# Patient Record
Sex: Female | Born: 1965 | Race: White | Hispanic: No | Marital: Married | Smoking: Former smoker
Health system: Southern US, Community
[De-identification: ages and names within clinical notes are randomized; demographics above are authoritative.]

## PROBLEM LIST (undated history)

## (undated) DIAGNOSIS — K219 Gastro-esophageal reflux disease without esophagitis: Secondary | ICD-10-CM

## (undated) DIAGNOSIS — F419 Anxiety disorder, unspecified: Secondary | ICD-10-CM

## (undated) DIAGNOSIS — I10 Essential (primary) hypertension: Secondary | ICD-10-CM

## (undated) DIAGNOSIS — E785 Hyperlipidemia, unspecified: Secondary | ICD-10-CM

## (undated) HISTORY — DX: Anxiety disorder, unspecified: F41.9

## (undated) HISTORY — PX: TONSILLECTOMY AND ADENOIDECTOMY: SHX28

## (undated) HISTORY — DX: Gastro-esophageal reflux disease without esophagitis: K21.9

## (undated) HISTORY — DX: Essential (primary) hypertension: I10

## (undated) HISTORY — DX: Hyperlipidemia, unspecified: E78.5

---

## 2013-01-04 DIAGNOSIS — F3132 Bipolar disorder, current episode depressed, moderate: Secondary | ICD-10-CM | POA: Insufficient documentation

## 2013-01-04 DIAGNOSIS — F17200 Nicotine dependence, unspecified, uncomplicated: Secondary | ICD-10-CM | POA: Insufficient documentation

## 2013-01-04 DIAGNOSIS — F411 Generalized anxiety disorder: Secondary | ICD-10-CM | POA: Insufficient documentation

## 2013-05-29 DIAGNOSIS — F4310 Post-traumatic stress disorder, unspecified: Secondary | ICD-10-CM | POA: Insufficient documentation

## 2013-07-23 DIAGNOSIS — G4700 Insomnia, unspecified: Secondary | ICD-10-CM | POA: Insufficient documentation

## 2013-07-23 DIAGNOSIS — R442 Other hallucinations: Secondary | ICD-10-CM | POA: Insufficient documentation

## 2014-10-25 DIAGNOSIS — J309 Allergic rhinitis, unspecified: Secondary | ICD-10-CM | POA: Insufficient documentation

## 2014-10-25 DIAGNOSIS — K219 Gastro-esophageal reflux disease without esophagitis: Secondary | ICD-10-CM | POA: Insufficient documentation

## 2017-08-03 DIAGNOSIS — Z800 Family history of malignant neoplasm of digestive organs: Secondary | ICD-10-CM | POA: Insufficient documentation

## 2017-08-03 DIAGNOSIS — N941 Unspecified dyspareunia: Secondary | ICD-10-CM | POA: Insufficient documentation

## 2018-03-02 DIAGNOSIS — E785 Hyperlipidemia, unspecified: Secondary | ICD-10-CM | POA: Insufficient documentation

## 2018-03-02 LAB — HM PAP SMEAR: HM Pap smear: NEGATIVE

## 2018-03-02 LAB — RESULTS CONSOLE HPV: CHL HPV: NEGATIVE

## 2018-11-30 DIAGNOSIS — N3946 Mixed incontinence: Secondary | ICD-10-CM | POA: Insufficient documentation

## 2020-02-01 LAB — HM HIV SCREENING LAB: HM HIV Screening: NEGATIVE

## 2020-02-01 LAB — HM HEPATITIS C SCREENING LAB: HM Hepatitis Screen: NEGATIVE

## 2020-02-04 DIAGNOSIS — K1321 Leukoplakia of oral mucosa, including tongue: Secondary | ICD-10-CM | POA: Insufficient documentation

## 2020-04-08 DIAGNOSIS — I10 Essential (primary) hypertension: Secondary | ICD-10-CM | POA: Insufficient documentation

## 2020-05-20 DIAGNOSIS — F1029 Alcohol dependence with unspecified alcohol-induced disorder: Secondary | ICD-10-CM | POA: Insufficient documentation

## 2020-07-03 LAB — HM COLONOSCOPY

## 2021-05-19 LAB — HM MAMMOGRAPHY

## 2022-01-04 DIAGNOSIS — H409 Unspecified glaucoma: Secondary | ICD-10-CM | POA: Insufficient documentation

## 2022-03-05 ENCOUNTER — Ambulatory Visit (INDEPENDENT_AMBULATORY_CARE_PROVIDER_SITE_OTHER)
Admission: RE | Admit: 2022-03-05 | Discharge: 2022-03-05 | Disposition: A | Discharge status: Home / Self Care | Payer: Medicare Other | Source: Ambulatory Visit | Attending: Nurse Practitioner | Admitting: Nurse Practitioner

## 2022-03-05 ENCOUNTER — Ambulatory Visit (INDEPENDENT_AMBULATORY_CARE_PROVIDER_SITE_OTHER): Payer: Medicare Other | Admitting: Nurse Practitioner

## 2022-03-05 ENCOUNTER — Encounter: Payer: Self-pay | Admitting: Nurse Practitioner

## 2022-03-05 VITALS — BP 110/68 | HR 84 | Temp 97.2°F | Ht 64.0 in | Wt 187.4 lb

## 2022-03-05 DIAGNOSIS — F411 Generalized anxiety disorder: Secondary | ICD-10-CM

## 2022-03-05 DIAGNOSIS — E66811 Obesity, class 1: Secondary | ICD-10-CM

## 2022-03-05 DIAGNOSIS — S91302A Unspecified open wound, left foot, initial encounter: Secondary | ICD-10-CM

## 2022-03-05 DIAGNOSIS — Z6832 Body mass index (BMI) 32.0-32.9, adult: Secondary | ICD-10-CM

## 2022-03-05 DIAGNOSIS — I10 Essential (primary) hypertension: Secondary | ICD-10-CM | POA: Diagnosis not present

## 2022-03-05 DIAGNOSIS — F17200 Nicotine dependence, unspecified, uncomplicated: Secondary | ICD-10-CM | POA: Diagnosis not present

## 2022-03-05 DIAGNOSIS — R7303 Prediabetes: Secondary | ICD-10-CM

## 2022-03-05 DIAGNOSIS — E785 Hyperlipidemia, unspecified: Secondary | ICD-10-CM | POA: Diagnosis not present

## 2022-03-05 DIAGNOSIS — E661 Drug-induced obesity: Secondary | ICD-10-CM

## 2022-03-05 DIAGNOSIS — F3132 Bipolar disorder, current episode depressed, moderate: Secondary | ICD-10-CM

## 2022-03-05 DIAGNOSIS — H409 Unspecified glaucoma: Secondary | ICD-10-CM

## 2022-03-05 NOTE — Assessment & Plan Note (Signed)
Patient currently on atorvastatin 20 mg.  Pending lab results continue taking atorvastatin 20 mg as prescribed

## 2022-03-05 NOTE — Assessment & Plan Note (Signed)
Patient currently maintained on amlodipine 10 mg daily.  Patient's blood pressure within normal limits in office visit

## 2022-03-05 NOTE — Assessment & Plan Note (Signed)
Currently managed by [facility] psychiatry.  Patient states she takes 325 mg of venlafaxine daily.  Continue taking medication as prescribed follow-up with psychiatry as recommended

## 2022-03-05 NOTE — Assessment & Plan Note (Signed)
Unsure of the [place] of the wound.  Was able to remove foreign bodies in office.  Will obtain x-ray of foot to make sure no retained foreign bodies if no retained foreign bodies she will soak in warm soapy water and keep covered to see if we can get it to heal if not we will refer to podiatry.  Pending foot x-ray

## 2022-03-05 NOTE — Assessment & Plan Note (Signed)
Likely related to lifestyle and medication induced.  Encouraged healthy lifestyle modifications pending labs

## 2022-03-05 NOTE — Assessment & Plan Note (Signed)
Patient is followed by psychiatry at [facility].  Continue taking medications as prescribed.  Follow-up as recommended

## 2022-03-05 NOTE — Progress Notes (Signed)
New Patient Office Visit  Subjective    Patient ID: Rachael Mosley, female    DOB: May 10, 1966  Age: 56 y.o. MRN: [id number]  CC:  Chief Complaint  Patient presents with   Establish Care    Np est care. Spider bite that wont heal x1 year. Weight management     HPI Rachael Mosley presents to establish care  Bipolar: Patient currently maintained on Abilify, Lamictal, venlafaxine.  Patient is followed through [facility] psychiatry.  HLD: eats 2-3 meals a day with little snacking. Water and seltzer water. Stationary bike and walking twice a wek at 15 mins a time  GAD: Patient currently being followed by [facility] psychiatry.  Currently on venlafaxine 375 mg daily  Tobacco use: cold [place] in the past. Lasted 2 years and then mother needed care. Last LDCT was 10/26/2021 that did show 75m nodule. Repeat in 1 year  Wound: Has a "bite" to the left lower extremity.  States has been there for a year and will not heal.  In the beginning he did have what seems to be like a serious sanguinous discharge but no discharge as of late.  Weight: States that she has been dieting since new years and lost 18 pounds for the last 6 weeks.  States that her psychiatrist has her on metformin due to the metabolic effects of the antipsychotics patient takes.  We discussed possibility using GLP-1 patient denies any history of medullary thyroid cancer, men's type II, pancreatitis.   Outpatient Encounter Medications as of 03/05/2022  Medication Sig   amLODipine (NORVASC) 10 MG tablet Take 10 mg by mouth daily.   ARIPiprazole (ABILIFY) 15 MG tablet Take 15 mg by mouth daily.   atorvastatin (LIPITOR) 20 MG tablet Take 20 mg by mouth daily.   gabapentin (NEURONTIN) 300 MG capsule Take 600 mg by mouth 2 (two) times daily.   lamoTRIgine (LAMICTAL) 200 MG tablet Take 200 mg by mouth 2 (two) times daily.   metFORMIN (GLUCOPHAGE) 1000 MG tablet Take 1,000 mg by mouth 2 (two) times daily with a meal.   omeprazole (PRILOSEC) 40 MG  capsule Take 40 mg by mouth daily.   venlafaxine (EFFEXOR) 37.5 MG tablet Take 375 mg by mouth daily.   [DISCONTINUED] amlodipine-atorvastatin (CADUET) 10-20 MG tablet Take 1 tablet by mouth daily.   No facility-administered encounter medications on file as of 03/05/2022.    Past Medical History:  Diagnosis Date   Anxiety    GERD (gastroesophageal reflux disease)    Hyperlipidemia    Hypertension     Past Surgical History:  Procedure Laterality Date   TONSILLECTOMY AND ADENOIDECTOMY      Family History  Problem Relation Age of Onset   Cancer Mother        FH maternal of esophegal cancer   Heart disease Father    Heart disease Paternal Grandfather     Social History   Socioeconomic History   Marital status: Married    Spouse name: Not on file   Number of children: 0   Years of education: Not on file   Highest education level: Doctorate  Occupational History   Not on file  Tobacco Use   Smoking status: Every Day    Packs/day: 1.00    Years: 25.00    Total pack years: 25.00    Types: Cigarettes   Smokeless tobacco: Never  Vaping Use   Vaping Use: Never used  Substance and Sexual Activity   Alcohol use: Yes    Comment:  1-2 drinks per night wine   Drug use: Not on file    Comment: once a day   Sexual activity: Yes  Other Topics Concern   Not on file  Social History Narrative   Retired Chief Executive Officer   Social Determinants of Radio broadcast assistant Strain: Not on file  Food Insecurity: Not on file  Transportation Needs: Not on file  Physical Activity: Not on file  Stress: Not on file  Social Connections: Not on file  Intimate Partner Violence: Not on file    Review of Systems  Constitutional:  Negative for chills and fever.  Respiratory:  Negative for shortness of breath.   Cardiovascular:  Negative for chest pain.  Skin:        Lesion "+"        Objective    BP 110/68   Pulse 84   Temp (!) 97.2 F (36.2 C) (Temporal)   Ht '5\' 4"'$  (1.626 m)    Wt 187 lb 6.4 oz (85 kg)   LMP 05/21/2020 (Approximate)   SpO2 92%   BMI 32.17 kg/m   Physical Exam Vitals and nursing note reviewed.  Constitutional:      Appearance: Normal appearance.  HENT:     Right Ear: Tympanic membrane, ear canal and external ear normal.     Left Ear: Tympanic membrane, ear canal and external ear normal.     Mouth/Throat:     Mouth: Mucous membranes are moist.     Pharynx: Oropharynx is clear.  Eyes:     Extraocular Movements: Extraocular movements intact.     Pupils: Pupils are equal, round, and reactive to light.     Comments: Wears glasses  Cardiovascular:     Rate and Rhythm: Normal rate and regular rhythm.     Heart sounds: Normal heart sounds.  Pulmonary:     Breath sounds: Normal breath sounds.  Abdominal:     General: Bowel sounds are normal.  Musculoskeletal:     Right lower leg: No edema.     Left lower leg: No edema.  Lymphadenopathy:     Cervical: No cervical adenopathy.  Skin:    Findings: Lesion present.          Comments: Lesion approximately the size of a pea.  On left distal lateral dorsal foot.  No erythema, edema, discharge.  Foreign body of what looks like to be animal hair was removed using sterile tweezers.  Neurological:     Mental Status: She is alert.  Psychiatric:        Mood and Affect: Mood normal.        Behavior: Behavior normal.        Thought Content: Thought content normal.        Judgment: Judgment normal.         Assessment & Plan:   Problem List Items Addressed This Visit       Cardiovascular and Mediastinum   Primary hypertension   Relevant Medications   atorvastatin (LIPITOR) 20 MG tablet   amLODipine (NORVASC) 10 MG tablet   Other Relevant Orders   CBC   Comprehensive metabolic panel   Lipid panel     Other   Bipolar disorder, current episode depressed, moderate (HCC)   Relevant Medications   venlafaxine (EFFEXOR) 37.5 MG tablet   Generalized anxiety disorder   Relevant Medications    venlafaxine (EFFEXOR) 37.5 MG tablet   Glaucoma   Hyperlipidemia   Relevant Medications   atorvastatin (LIPITOR) 20  MG tablet   amLODipine (NORVASC) 10 MG tablet   Other Relevant Orders   Lipid panel   Tobacco use disorder   Other Visit Diagnoses     Open wound of left foot excluding one or more toes, initial encounter    -  Primary   Relevant Orders   DG Foot Complete Left   Pre-diabetes       Relevant Orders   Hemoglobin A1c   TSH   Lipid panel   Class 1 drug-induced obesity with serious comorbidity and body mass index (BMI) of 32.0 to 32.9 in adult       Relevant Medications   metFORMIN (GLUCOPHAGE) 1000 MG tablet   Other Relevant Orders   TSH       Return in about 3 months (around 06/05/2022) for Recheck.   Romilda Garret, NP

## 2022-03-05 NOTE — Patient Instructions (Signed)
Nice to see you today I will be in touch with the labs once I have the result. Also will be in touch with the xray once I get the result Follow up with me in 3 months, sooner if you need me  Those medications are [facility] and Saxenda

## 2022-03-05 NOTE — Assessment & Plan Note (Signed)
Patient has quit in the past via the cold [place] method.  She is going start working on reducing and trying to quit smoking again.

## 2022-03-05 NOTE — Assessment & Plan Note (Signed)
Secondary to antipsychotic medication.  Patient currently maintained on metformin currently.  We did discuss about a GLP-1 to help with her weight and glycemic control.  Pending labs

## 2022-03-05 NOTE — Assessment & Plan Note (Signed)
Currently followed by eye care provider.  She states that she is seen every 6 months

## 2022-03-08 ENCOUNTER — Other Ambulatory Visit: Payer: Self-pay | Admitting: Nurse Practitioner

## 2022-03-08 DIAGNOSIS — S91302A Unspecified open wound, left foot, initial encounter: Secondary | ICD-10-CM

## 2022-03-11 ENCOUNTER — Other Ambulatory Visit (INDEPENDENT_AMBULATORY_CARE_PROVIDER_SITE_OTHER): Payer: Medicare Other

## 2022-03-11 DIAGNOSIS — I10 Essential (primary) hypertension: Secondary | ICD-10-CM | POA: Diagnosis not present

## 2022-03-11 DIAGNOSIS — E661 Drug-induced obesity: Secondary | ICD-10-CM | POA: Diagnosis not present

## 2022-03-11 DIAGNOSIS — E785 Hyperlipidemia, unspecified: Secondary | ICD-10-CM

## 2022-03-11 DIAGNOSIS — R7303 Prediabetes: Secondary | ICD-10-CM | POA: Diagnosis not present

## 2022-03-11 DIAGNOSIS — Z6832 Body mass index (BMI) 32.0-32.9, adult: Secondary | ICD-10-CM | POA: Diagnosis not present

## 2022-03-11 LAB — COMPREHENSIVE METABOLIC PANEL
ALT: 12 U/L (ref 0–35)
AST: 14 U/L (ref 0–37)
Albumin: 4.1 g/dL (ref 3.5–5.2)
Alkaline Phosphatase: 91 U/L (ref 39–117)
BUN: 11 mg/dL (ref 6–23)
CO2: 26 mEq/L (ref 19–32)
Calcium: 9.5 mg/dL (ref 8.4–10.5)
Chloride: 105 mEq/L (ref 96–112)
Creatinine, Ser: 0.51 mg/dL (ref 0.40–1.20)
GFR: 106.44 mL/min (ref >60.00)
Glucose, Bld: 99 mg/dL (ref 70–99)
Potassium: 4.3 mEq/L (ref 3.5–5.1)
Sodium: 140 mEq/L (ref 135–145)
Total Bilirubin: 0.2 mg/dL (ref 0.2–1.2)
Total Protein: 6.8 g/dL (ref 6.0–8.3)

## 2022-03-11 LAB — CBC
HCT: 45 % (ref 36.0–46.0)
Hemoglobin: 14.6 g/dL (ref 12.0–15.0)
MCHC: 32.5 g/dL (ref 30.0–36.0)
MCV: 89.3 fl (ref 78.0–100.0)
Platelets: 269 10*3/uL (ref 150.0–400.0)
RBC: 5.04 Mil/uL (ref 3.87–5.11)
RDW: 15.9 % — ABNORMAL HIGH (ref 11.5–15.5)
WBC: 15 10*3/uL — ABNORMAL HIGH (ref 4.0–10.5)

## 2022-03-11 LAB — HEMOGLOBIN A1C: Hgb A1c MFr Bld: 6.2 % (ref 4.6–6.5)

## 2022-03-11 LAB — LDL CHOLESTEROL, DIRECT: Direct LDL: 73 mg/dL

## 2022-03-11 LAB — LIPID PANEL
Cholesterol: 151 mg/dL (ref 0–200)
HDL: 41.1 mg/dL (ref >39.00)
NonHDL: 110.34
Total CHOL/HDL Ratio: 4
Triglycerides: 313 mg/dL — ABNORMAL HIGH (ref 0.0–149.0)
VLDL: 62.6 mg/dL — ABNORMAL HIGH (ref 0.0–40.0)

## 2022-03-11 LAB — TSH: TSH: 1.22 u[IU]/mL (ref 0.35–5.50)

## 2022-03-15 ENCOUNTER — Other Ambulatory Visit: Payer: Self-pay | Admitting: Nurse Practitioner

## 2022-03-15 DIAGNOSIS — R7303 Prediabetes: Secondary | ICD-10-CM

## 2022-03-15 DIAGNOSIS — E661 Drug-induced obesity: Secondary | ICD-10-CM

## 2022-03-15 MED ORDER — WEGOVY 0.25 MG/0.5ML ~~LOC~~ SOAJ: 0.2500 mg | SUBCUTANEOUS | 0 refills | Status: DC

## 2022-03-18 ENCOUNTER — Other Ambulatory Visit: Payer: Self-pay | Admitting: Podiatry

## 2022-03-18 ENCOUNTER — Ambulatory Visit (INDEPENDENT_AMBULATORY_CARE_PROVIDER_SITE_OTHER): Payer: Medicare Other

## 2022-03-18 ENCOUNTER — Ambulatory Visit (INDEPENDENT_AMBULATORY_CARE_PROVIDER_SITE_OTHER): Payer: Medicare Other | Admitting: Podiatry

## 2022-03-18 DIAGNOSIS — M7752 Other enthesopathy of left foot: Secondary | ICD-10-CM | POA: Diagnosis not present

## 2022-03-18 DIAGNOSIS — M1990 Unspecified osteoarthritis, unspecified site: Secondary | ICD-10-CM | POA: Diagnosis not present

## 2022-03-18 DIAGNOSIS — M21619 Bunion of unspecified foot: Secondary | ICD-10-CM

## 2022-03-25 NOTE — Progress Notes (Signed)
Subjective:  Patient ID: Rachael Mosley, female    DOB: [birth year],  MRN: [id number]  No chief complaint on file.   56 y.o. female presents with the above complaint.  Patient presents with complaint left first metatarsophalangeal joint.  Patient states pain for touch is progressive gotten worse.  There is a bump on the joint.  Patient is a diabetic with last A1c of 6.2.  Hurts with ambulation pain scale 7 out of 10 dull aching nature she has not seen anyone else prior to seeing me.   Review of Systems: Negative except as noted in the HPI. Denies N/V/F/Ch.  Past Medical History:  Diagnosis Date   Anxiety    GERD (gastroesophageal reflux disease)    Hyperlipidemia    Hypertension     Current Outpatient Medications:    amLODipine (NORVASC) 10 MG tablet, Take 10 mg by mouth daily., Disp: , Rfl:    ARIPiprazole (ABILIFY) 15 MG tablet, Take 15 mg by mouth daily., Disp: , Rfl:    atorvastatin (LIPITOR) 20 MG tablet, Take 20 mg by mouth daily., Disp: , Rfl:    gabapentin (NEURONTIN) 300 MG capsule, Take 600 mg by mouth 2 (two) times daily., Disp: , Rfl:    lamoTRIgine (LAMICTAL) 200 MG tablet, Take 200 mg by mouth 2 (two) times daily., Disp: , Rfl:    metFORMIN (GLUCOPHAGE) 1000 MG tablet, Take 1,000 mg by mouth 2 (two) times daily with a meal., Disp: , Rfl:    omeprazole (PRILOSEC) 40 MG capsule, Take 40 mg by mouth daily., Disp: , Rfl:    Semaglutide-Weight Management (WEGOVY) 0.25 MG/0.5ML SOAJ, Inject 0.25 mg into the skin once a week., Disp: 2 mL, Rfl: 0   venlafaxine (EFFEXOR) 37.5 MG tablet, Take 375 mg by mouth daily., Disp: , Rfl:   Social History   Tobacco Use  Smoking Status Every Day   Packs/day: 1.00   Years: 25.00   Total pack years: 25.00   Types: Cigarettes  Smokeless Tobacco Never    Not on File Objective:  There were no vitals filed for this visit. There is no height or weight on file to calculate BMI. Constitutional Well developed. Well nourished.  Vascular  Dorsalis pedis pulses palpable bilaterally. Posterior tibial pulses palpable bilaterally. Capillary refill normal to all digits.  No cyanosis or clubbing noted. Pedal hair growth normal.  Neurologic Normal speech. Oriented to person, place, and time. Epicritic sensation to light touch grossly present bilaterally.  Dermatologic Nails well groomed and normal in appearance. No open wounds. No skin lesions.  Orthopedic: Pain on palpation left first metatarsophalangeal joint range of motion limited.  Patient has hallux limitus.  No extensor or flexor tendinitis noted.  No pain at the sesamoidal complex.  Deep intra-articular first MPJ pain noted.   Radiographs: 3 views of skeletally mature adult left foot: Osteoarthritic changes noted at the first metatarsophalangeal joint.  Uneven joint space narrowing noted with osteophytes and dorsal spurring.  No other bony abnormalities identified. Assessment:   1. Capsulitis of metatarsophalangeal (MTP) joint of left foot    Plan:  Patient was evaluated and treated and all questions answered.  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  Given the amount of pain that she is having she will benefit from steroid injection help decrease acute inflammatory component associate with pain.  Patient agrees with plan like to proceed with a steroid injection -A steroid injection was performed at  left first metatarsophalangeal joint using 1% plain Lidocaine and 10 mg of Kenalog. This was well tolerated.   No follow-ups on file.

## 2022-03-26 ENCOUNTER — Telehealth: Payer: Self-pay | Admitting: Nurse Practitioner

## 2022-03-26 NOTE — Telephone Encounter (Signed)
Call patient to reschedule appointment and appointment has been schedule on 06/14/22 at 10:00 and she asked the status of her getting medication weygovy. Call back number [phone].

## 2022-03-30 NOTE — Telephone Encounter (Signed)
Spoke with patient and let her know I will check with [facility] and insurance to see if this is covered. Patient also stated she wanted to use [facility] for the medication. I called [facility] where medication was sent in originally, I was told this medication was not covered-not part of a formulary covered benefit with her insurance (medication member ID number just in case is [id number]). They checked with insurance and did not have an alternative covered medication to replace this. I did not call patient yet, will get providers feedback first.

## 2022-03-30 NOTE — Telephone Encounter (Signed)
Given her insurance they may not cover any weight loss medications. If she is really concerned about the weight we can refer her to the healthy weight and wellness clinif

## 2022-03-30 NOTE — Telephone Encounter (Signed)
Patient advised. Patient agrees to referral to Healthy weight and wellness clinic.

## 2022-03-31 ENCOUNTER — Other Ambulatory Visit: Payer: Self-pay | Admitting: Nurse Practitioner

## 2022-03-31 DIAGNOSIS — E661 Drug-induced obesity: Secondary | ICD-10-CM

## 2022-03-31 NOTE — Telephone Encounter (Signed)
Ambulatory referral placed

## 2022-04-02 DIAGNOSIS — Z0289 Encounter for other administrative examinations: Secondary | ICD-10-CM

## 2022-04-19 ENCOUNTER — Encounter: Payer: Self-pay | Admitting: Nurse Practitioner

## 2022-04-19 DIAGNOSIS — L989 Disorder of the skin and subcutaneous tissue, unspecified: Secondary | ICD-10-CM

## 2022-04-19 DIAGNOSIS — N941 Unspecified dyspareunia: Secondary | ICD-10-CM

## 2022-04-19 DIAGNOSIS — Z1231 Encounter for screening mammogram for malignant neoplasm of breast: Secondary | ICD-10-CM

## 2022-04-20 ENCOUNTER — Other Ambulatory Visit (INDEPENDENT_AMBULATORY_CARE_PROVIDER_SITE_OTHER): Payer: Self-pay | Admitting: Family Medicine

## 2022-04-20 ENCOUNTER — Encounter (INDEPENDENT_AMBULATORY_CARE_PROVIDER_SITE_OTHER): Payer: Self-pay | Admitting: Family Medicine

## 2022-04-20 ENCOUNTER — Ambulatory Visit (INDEPENDENT_AMBULATORY_CARE_PROVIDER_SITE_OTHER): Payer: Medicare Other | Admitting: Family Medicine

## 2022-04-20 VITALS — BP 125/85 | HR 82 | Temp 98.8°F | Ht 64.0 in | Wt 188.0 lb

## 2022-04-20 DIAGNOSIS — R0602 Shortness of breath: Secondary | ICD-10-CM | POA: Diagnosis not present

## 2022-04-20 DIAGNOSIS — Z1331 Encounter for screening for depression: Secondary | ICD-10-CM

## 2022-04-20 DIAGNOSIS — H409 Unspecified glaucoma: Secondary | ICD-10-CM | POA: Diagnosis not present

## 2022-04-20 DIAGNOSIS — F17210 Nicotine dependence, cigarettes, uncomplicated: Secondary | ICD-10-CM

## 2022-04-20 DIAGNOSIS — R5383 Other fatigue: Secondary | ICD-10-CM

## 2022-04-20 DIAGNOSIS — I10 Essential (primary) hypertension: Secondary | ICD-10-CM | POA: Diagnosis not present

## 2022-04-20 DIAGNOSIS — F3132 Bipolar disorder, current episode depressed, moderate: Secondary | ICD-10-CM | POA: Diagnosis not present

## 2022-04-20 DIAGNOSIS — E669 Obesity, unspecified: Secondary | ICD-10-CM

## 2022-04-20 DIAGNOSIS — R7303 Prediabetes: Secondary | ICD-10-CM | POA: Diagnosis not present

## 2022-04-20 DIAGNOSIS — E559 Vitamin D deficiency, unspecified: Secondary | ICD-10-CM | POA: Diagnosis not present

## 2022-04-20 DIAGNOSIS — Z6832 Body mass index (BMI) 32.0-32.9, adult: Secondary | ICD-10-CM

## 2022-04-20 DIAGNOSIS — Z8262 Family history of osteoporosis: Secondary | ICD-10-CM

## 2022-04-20 DIAGNOSIS — E8881 Metabolic syndrome: Secondary | ICD-10-CM

## 2022-04-21 LAB — VITAMIN B12: Vitamin B-12: 305 pg/mL (ref 232–1245)

## 2022-04-26 LAB — INSULIN, FREE AND TOTAL
Free Insulin: 7.6 uU/mL
Total Insulin: 7.6 uU/mL

## 2022-04-26 LAB — VITAMIN D 25 HYDROXY (VIT D DEFICIENCY, FRACTURES): Vit D, 25-Hydroxy: 33.2 ng/mL (ref 30.0–100.0)

## 2022-04-28 ENCOUNTER — Encounter (INDEPENDENT_AMBULATORY_CARE_PROVIDER_SITE_OTHER): Payer: Self-pay

## 2022-04-28 NOTE — Progress Notes (Unsigned)
Chief Complaint:   OBESITY Rachael Mosley (MR# [id number]) is a 56 y.o. female who presents for evaluation and treatment of obesity and related comorbidities. Current BMI is Body mass index is 32.27 kg/m. Rachael Mosley has been struggling with her weight for many years and has been unsuccessful in either losing weight, maintaining weight loss, or reaching her healthy weight goal.  Rachael Mosley gaining weight on antipsychotics for bipolar since 2020.  Mood is much better.  Usually eats 3 meals per day.  Denies large portions.  Sometimes ice cream at night with control of portions.  Has gone through menopause.  Walks or rides bike 5 days/week.  Going to a camp for 10 days, leaving Thursday.  Lost 10 pounds since January, but has stalled in further weight loss.  [name] is currently in the action stage of change and ready to dedicate time achieving and maintaining a healthier weight. [name] is interested in becoming our patient and working on intensive lifestyle modifications including (but not limited to) diet and exercise for weight loss.  [name]'s habits were reviewed today and are as follows: Her family eats meals together, she thinks her family will eat healthier with her, she struggles with family and or coworkers weight loss sabotage, her desired weight loss is 53 lbs, she started gaining weight since starting Abilify, her heaviest weight ever was 198 pounds, she is a picky eater and doesn't like to eat healthier foods, she has significant food cravings issues, she skips meals frequently, she is frequently drinking liquids with calories, and she frequently makes poor food choices.  Depression Screen Rachael Mosley's Food and Mood (modified PHQ-9) score was 6.     03/05/2022    3:42 PM  Depression screen PHQ 2/9  Decreased Interest 3  Down, Depressed, Hopeless 3  PHQ - 2 Score 6   Subjective:   1. Other fatigue Rachael Mosley admits to daytime somnolence and denies waking up still tired. Patient has a  history of symptoms of daytime fatigue. Rachael Mosley generally gets 8 or 9 hours of sleep per night, and states that she has generally restful sleep. Snoring is present. Apneic episodes are present. Epworth Sleepiness Score is 4.   2. SOB (shortness of breath) Rachael Mosley notes increasing shortness of breath with exercising and seems to be worsening over time with weight gain. She notes getting out of breath sooner with activity than she used to. This has not gotten worse recently. [name] denies shortness of breath at rest or orthopnea.  3. Bipolar disorder, current episode depressed, moderate (Rachael Mosley) Rachael Mosley is on Abilify, venlafaxine, and Lamictal.  Status post ECT 2011.  She is followed by [facility] psychiatry.  She reports stable mood and good support at home.  4. Glaucoma, unspecified glaucoma type, unspecified laterality Rachael Mosley is followed by optometry.  She is not on prescription medication.  5. Essential hypertension Rachael Mosley is on amlodipine 10 mg daily.  Her blood pressure is well-controlled, and she denies chest pain.  6. Prediabetes Rachael Mosley last A1c was 6.2 on 03/11/2022.  She is at high risk for type 2 diabetes mellitus with the use of a typical antipsychotics.  She craves sugar.  7. Metabolic syndrome Rachael Mosley is on metformin 1000 mg twice daily with food.  Her last triglycerides was 313, HDL 41, and abdominal circumference >24 c/w metabolic syndrome.  8. Vitamin D deficiency Rachael Mosley is due for DEXA scan, 2 years post menopausal.  She is not on a vitamin D supplementation.  Assessment/Plan:   1. Other fatigue Rachael Mosley  does feel that her weight is causing her energy to be lower than it should be. Fatigue may be related to obesity, depression or many other causes. Labs will be ordered, and in the meanwhile, [name] will focus on self care including making healthy food choices, increasing physical activity and focusing on stress reduction.  - EKG 12-Lead - Vitamin B12  2. SOB (shortness of  breath) [name] does feel that she gets out of breath more easily that she used to when she exercises. [name]'s shortness of breath appears to be obesity related and exercise induced. She has agreed to work on weight loss and gradually increase exercise to treat her exercise induced shortness of breath. Will continue to monitor closely.  3. Bipolar disorder, current episode depressed, moderate ([name]) [name] will continue her current treatment plan per psychiatry.  4. Glaucoma, unspecified glaucoma type, unspecified laterality Rachael Mosley will avoid the use of phentermine with a history of glaucoma, hypertension, and mood disorder.  5. Essential hypertension Rachael Mosley will continue amlodipine 10 mg daily per PCP.  6. Prediabetes We will check labs today.  Rachael Mosley will work on decreasing her intake of sugar and starchy foods.  She will work on weight loss, regular exercise, and we will consider adding Rybelsus or Ozempic.  - Insulin, Free and Total  7. Metabolic syndrome Rachael Mosley will continue her plan of care as listed under prediabetes mellitus.  She will continue metformin 1000 mg twice daily per psych.  8. Vitamin D deficiency We will check labs today.  Rachael Mosley will ask her PCP for DEXA order.  - VITAMIN D 25 Hydroxy (Vit-D Deficiency, Fractures)  9. Depression screening Rachael Mosley had a positive depression screening. Depression is commonly associated with obesity and often results in emotional eating behaviors. We will monitor this closely and work on CBT to help improve the non-hunger eating patterns. Referral to Psychology may be required if no improvement is seen as she continues in our clinic.  10. Obesity, current BMI- 32.3 Rachael Mosley is currently in the action stage of change and her goal is to continue with weight loss efforts. I recommend Rachael Mosley begin the structured treatment plan as follows:  She has agreed to keeping a food journal and adhering to recommended goals of 1600 calories  and 80-90 grams of protein daily and the Rachael Mosley.  Exercise goals: As is.    Behavioral modification strategies: increasing lean protein intake, increasing vegetables, increasing water intake, decreasing eating out, no skipping meals, meal planning and cooking strategies, keeping healthy foods in the home, and decreasing junk food.  She was informed of the importance of frequent follow-up visits to maximize her success with intensive lifestyle modifications for her multiple health conditions. She was informed we would discuss her lab results at her next visit unless there is a critical issue that needs to be addressed sooner. Rachael Mosley agreed to keep her next visit at the agreed upon time to discuss these results.  Objective:   Blood pressure 125/85, pulse 82, temperature 98.8 F (37.1 C), height '5\' 4"'$  (1.626 m), weight 188 lb (85.3 kg), last menstrual period 05/21/2020, SpO2 96 %. Body mass index is 32.27 kg/m.  EKG: Normal sinus rhythm, rate 81 BPM.  Indirect Calorimeter completed today shows a VO2 of 308 and a REE of 2131.  Her calculated basal metabolic rate is 0263 thus her basal metabolic rate is better than expected.  General: Cooperative, alert, well developed, in no acute distress. HEENT: Conjunctivae and lids unremarkable. Cardiovascular: Regular rhythm.  Lungs: Normal  work of breathing. Neurologic: No focal deficits.   Lab Results  Component Value Date   CREATININE 0.51 03/11/2022   BUN 11 03/11/2022   NA 140 03/11/2022   K 4.3 03/11/2022   CL 105 03/11/2022   CO2 26 03/11/2022   Lab Results  Component Value Date   ALT 12 03/11/2022   AST 14 03/11/2022   ALKPHOS 91 03/11/2022   BILITOT 0.2 03/11/2022   Lab Results  Component Value Date   HGBA1C 6.2 03/11/2022   No results found for: "INSULIN" Lab Results  Component Value Date   TSH 1.22 03/11/2022   Lab Results  Component Value Date   CHOL 151 03/11/2022   HDL 41.10 03/11/2022   LDLDIRECT 73.0  03/11/2022   TRIG 313.0 (H) 03/11/2022   CHOLHDL 4 03/11/2022   Lab Results  Component Value Date   WBC 15.0 (H) 03/11/2022   HGB 14.6 03/11/2022   HCT 45.0 03/11/2022   MCV 89.3 03/11/2022   PLT 269.0 03/11/2022   No results found for: "IRON", "TIBC", "FERRITIN" Obesity Behavioral Intervention:   Approximately 15 minutes were spent on the discussion below.  ASK: We discussed the diagnosis of obesity with Rachael Mosley today and Rachael Mosley agreed to give [place] permission to discuss obesity behavioral modification therapy today.  ASSESS: Rachael Mosley has the diagnosis of obesity and her BMI today is 32.3. Rachael Mosley is in the action stage of change.   ADVISE: Rachael Mosley was educated on the multiple health risks of obesity as well as the benefit of weight loss to improve her health. She was advised of the need for long term treatment and the importance of lifestyle modifications to improve her current health and to decrease her risk of future health problems.  AGREE: Multiple dietary modification options and treatment options were discussed and Annistyn agreed to follow the recommendations documented in the above note.  ARRANGE: Dailey was educated on the importance of frequent visits to treat obesity as outlined per CMS and USPSTF guidelines and agreed to schedule her next follow up appointment today.  Attestation Statements:   Reviewed by clinician on day of visit: allergies, medications, problem list, medical history, surgical history, family history, social history, and previous encounter notes.   Wilhemena Durie, am acting as transcriptionist for Loyal Gambler, DO.  I have reviewed the above documentation for accuracy and completeness, and I agree with the above. Dell Ponto, DO

## 2022-05-04 ENCOUNTER — Ambulatory Visit (INDEPENDENT_AMBULATORY_CARE_PROVIDER_SITE_OTHER): Payer: Medicare Other | Admitting: Family Medicine

## 2022-05-11 ENCOUNTER — Ambulatory Visit (INDEPENDENT_AMBULATORY_CARE_PROVIDER_SITE_OTHER): Payer: Medicare Other | Admitting: Family Medicine

## 2022-05-11 ENCOUNTER — Encounter (INDEPENDENT_AMBULATORY_CARE_PROVIDER_SITE_OTHER): Payer: Self-pay | Admitting: Family Medicine

## 2022-05-11 VITALS — BP 115/80 | HR 85 | Temp 98.8°F | Ht 64.0 in | Wt 191.0 lb

## 2022-05-11 DIAGNOSIS — E8881 Metabolic syndrome: Secondary | ICD-10-CM

## 2022-05-11 DIAGNOSIS — I10 Essential (primary) hypertension: Secondary | ICD-10-CM | POA: Diagnosis not present

## 2022-05-11 DIAGNOSIS — F319 Bipolar disorder, unspecified: Secondary | ICD-10-CM | POA: Diagnosis not present

## 2022-05-11 DIAGNOSIS — R7303 Prediabetes: Secondary | ICD-10-CM | POA: Insufficient documentation

## 2022-05-11 DIAGNOSIS — E669 Obesity, unspecified: Secondary | ICD-10-CM

## 2022-05-11 DIAGNOSIS — F3289 Other specified depressive episodes: Secondary | ICD-10-CM

## 2022-05-11 DIAGNOSIS — E559 Vitamin D deficiency, unspecified: Secondary | ICD-10-CM | POA: Insufficient documentation

## 2022-05-11 DIAGNOSIS — Z6832 Body mass index (BMI) 32.0-32.9, adult: Secondary | ICD-10-CM

## 2022-05-11 DIAGNOSIS — F32A Depression, unspecified: Secondary | ICD-10-CM | POA: Insufficient documentation

## 2022-05-11 DIAGNOSIS — Z7984 Long term (current) use of oral hypoglycemic drugs: Secondary | ICD-10-CM

## 2022-05-16 NOTE — Progress Notes (Unsigned)
Chief Complaint:   OBESITY Rachael Mosley is here to discuss her progress with her obesity treatment plan along with follow-up of her obesity related diagnoses. Rachael Mosley is on keeping a food journal and adhering to recommended goals of 1600 calories and 80-90 protein and states she is following her eating plan approximately 50% of the time. Rachael Mosley states she is walking and kayaking all day for 7 days for one week only.   Today's visit was #: 2 Starting weight: 188 lbs Starting date: 04/20/2022 Today's weight: 191 lbs Today's date: 05/11/2022 Total lbs lost to date: 0 Total lbs lost since last in-office visit: +3  Interim History: Was at camp for 10 days, ate poorly. Started meal plan 05/03/2022.  Having hunger and cravings.  Husband is not supportive.  Getting in fruits, veggies, water.  Quit smoking 03/31/2022.  Eating a lot of sugar free snacks, jello, pudding.   Subjective:   1. Metabolic syndrome Triglyceride >150, HDL <49, abdominal circumference >35" On metformin 1000 mg BID per psychiatry, continues to consume and crave sugar.  2. Bipolar affective disorder, remission status unspecified ([place]) Sees psych at [facility], on Lamictal, abilify, venlafaxine .  Mood stable.   3. Essential hypertension Blood pressure at goal, on Amlodipine 10 mg daily.  Denies chest pain.   4. Prediabetes 03/11/2022, A1c 6.2.  On metformin 1000 mg BID no adverse side effects.    5. Vitamin D deficiency On 1600 IU daily with current supplements.  6. Other depression, with emotional eating Consider Wellbutrin, awaiting psych visit to ask about it.   Assessment/Plan:   1. Metabolic syndrome Decrease intake of sugar and artificial sweeteners .  Focus on protein with meals.   2. Bipolar affective disorder, remission status unspecified ([place]) Keep follow up with psychiatry and continue current medications.   3. Essential hypertension Continue Amlodipine 10 mg daly.   4. Prediabetes Continue  metformin and work on decreasing intake of sugar, increase regular exercise.   5. Vitamin D deficiency Begin OTC Vitamin D 1,000 IU daily.   6. Other depression, with emotional eating Follow up after psych visit.   7. Obesity, current BMI 32.8 Rachael Mosley is currently in the action stage of change. As such, her goal is to continue with weight loss efforts. She has agreed to the Category 3 Plan.   Exercise goals:  Walking 30 minutes 4 times per week.   Behavioral modification strategies: increasing lean protein intake, increasing vegetables, increasing water intake, no skipping meals, keeping healthy foods in the home, better snacking choices, avoiding temptations, and decreasing junk food.  Rachael Mosley has agreed to follow-up with our clinic in 3 weeks. She was informed of the importance of frequent follow-up visits to maximize her success with intensive lifestyle modifications for her multiple health conditions.   Objective:   Blood pressure 115/80, pulse 85, temperature 98.8 F (37.1 C), height '5\' 4"'$  (1.626 m), weight 191 lb (86.6 kg), last menstrual period 05/21/2020, SpO2 97 %. Body mass index is 32.79 kg/m.  General: Cooperative, alert, well developed, in no acute distress. HEENT: Conjunctivae and lids unremarkable. Cardiovascular: Regular rhythm.  Lungs: Normal work of breathing. Neurologic: No focal deficits.   Lab Results  Component Value Date   CREATININE 0.51 03/11/2022   BUN 11 03/11/2022   NA 140 03/11/2022   K 4.3 03/11/2022   CL 105 03/11/2022   CO2 26 03/11/2022   Lab Results  Component Value Date   ALT 12 03/11/2022   AST 14 03/11/2022  ALKPHOS 91 03/11/2022   BILITOT 0.2 03/11/2022   Lab Results  Component Value Date   HGBA1C 6.2 03/11/2022   No results found for: "INSULIN" Lab Results  Component Value Date   TSH 1.22 03/11/2022   Lab Results  Component Value Date   CHOL 151 03/11/2022   HDL 41.10 03/11/2022   LDLDIRECT 73.0 03/11/2022   TRIG  313.0 (H) 03/11/2022   CHOLHDL 4 03/11/2022   Lab Results  Component Value Date   VD25OH 33.2 04/20/2022   Lab Results  Component Value Date   WBC 15.0 (H) 03/11/2022   HGB 14.6 03/11/2022   HCT 45.0 03/11/2022   MCV 89.3 03/11/2022   PLT 269.0 03/11/2022   No results found for: "IRON", "TIBC", "FERRITIN"  Obesity Behavioral Intervention:   Approximately 15 minutes were spent on the discussion below.  ASK: We discussed the diagnosis of obesity with Rachael Mosley today and Rachael Mosley agreed to give [place] permission to discuss obesity behavioral modification therapy today.  ASSESS: Rachael Mosley has the diagnosis of obesity and her BMI today is 32.8. Deasia is in the action stage of change.   ADVISE: Rachael Mosley was educated on the multiple health risks of obesity as well as the benefit of weight loss to improve her health. She was advised of the need for long term treatment and the importance of lifestyle modifications to improve her current health and to decrease her risk of future health problems.  AGREE: Multiple dietary modification options and treatment options were discussed and Adrian agreed to follow the recommendations documented in the above note.  ARRANGE: Rachael Mosley was educated on the importance of frequent visits to treat obesity as outlined per CMS and USPSTF guidelines and agreed to schedule her next follow up appointment today.  Attestation Statements:   Reviewed by clinician on day of visit: allergies, medications, problem list, medical history, surgical history, family history, social history, and previous encounter notes.  I, Davy Pique, am acting as Location manager for Loyal Gambler, DO.  I have reviewed the above documentation for accuracy and completeness, and I agree with the above. Dell Ponto, DO

## 2022-05-18 ENCOUNTER — Other Ambulatory Visit (HOSPITAL_COMMUNITY)
Admission: RE | Admit: 2022-05-18 | Discharge: 2022-05-18 | Disposition: A | Discharge status: Home / Self Care | Payer: Medicare Other | Source: Ambulatory Visit | Attending: Obstetrics and Gynecology | Admitting: Obstetrics and Gynecology

## 2022-05-18 ENCOUNTER — Ambulatory Visit (INDEPENDENT_AMBULATORY_CARE_PROVIDER_SITE_OTHER): Payer: Medicare Other | Admitting: Obstetrics and Gynecology

## 2022-05-18 ENCOUNTER — Encounter: Payer: Self-pay | Admitting: Obstetrics and Gynecology

## 2022-05-18 VITALS — BP 118/64 | Ht 64.5 in | Wt 189.0 lb

## 2022-05-18 DIAGNOSIS — N952 Postmenopausal atrophic vaginitis: Secondary | ICD-10-CM | POA: Diagnosis not present

## 2022-05-18 DIAGNOSIS — Z124 Encounter for screening for malignant neoplasm of cervix: Secondary | ICD-10-CM | POA: Diagnosis present

## 2022-05-18 DIAGNOSIS — N941 Unspecified dyspareunia: Secondary | ICD-10-CM | POA: Diagnosis not present

## 2022-05-18 LAB — POCT WET PREP WITH KOH
Clue Cells Wet Prep HPF POC: NEGATIVE
KOH Prep POC: NEGATIVE
Trichomonas, UA: NEGATIVE
Yeast Wet Prep HPF POC: NEGATIVE

## 2022-05-18 MED ORDER — FLUCONAZOLE 150 MG PO TABS: 150.0000 mg | ORAL_TABLET | Freq: Once | ORAL | 0 refills | Status: AC

## 2022-05-18 MED ORDER — ESTRADIOL 0.1 MG/GM VA CREA: TOPICAL_CREAM | VAGINAL | 1 refills | Status: DC

## 2022-05-18 NOTE — Progress Notes (Signed)
Michela Pitcher, NP   Chief Complaint  Patient presents with   Vaginal Pain    X 3 months    HPI:      Rachael Mosley is a 55 y.o. No obstetric history on file. whose LMP was Patient's last menstrual period was 05/21/2020 (approximate)., presents today for NP eval of vaginal pain with sex for 3 months. Sx started after abx use. Treated with monistat-3 but sex continues to be painful and feels like "fire" to pt. No increased vag d/c, odor; no vag sx if not sexually active. Using lubricants without relief, sx resolve after cessation of sex. Used lubricants before abx use with sx relief. Had some vaginal dryness prior to abx use. No PC bleeding. Uses dryer sheets, wears liners daily due to occas urinary incont.   Pt is postmenopausal since 9/21. No PMB, improving vasomotor sx.  No hx of breast cancer/DVTs. Quit tob use.   03/02/18 neg HPV DNA on cytology per Epic  Patient Active Problem List   Diagnosis Date Noted   Metabolic syndrome 63/78/5885   Bipolar disorder ([place]) 05/11/2022   Essential hypertension 05/11/2022   Prediabetes 05/11/2022   Vitamin D deficiency 05/11/2022   Depression 05/11/2022   Open wound of left foot excluding one or more toes 03/05/2022   Pre-diabetes 03/05/2022   Class 1 drug-induced obesity with serious comorbidity and body mass index (BMI) of 32.0 to 32.9 in adult 03/05/2022   Glaucoma 01/04/2022   Alcohol dependence with unspecified alcohol-induced disorder ([place]) 05/20/2020   Primary hypertension 04/08/2020   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   Mixed stress and urge urinary incontinence 11/30/2018   Hyperlipidemia 03/02/2018   Dyspareunia, female 08/03/2017   Family history of esophageal cancer 08/03/2017   Allergic rhinitis 10/25/2014   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/2014   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   Insomnia 07/23/2013   PTSD (post-traumatic stress disorder) 05/29/2013   Bipolar disorder, current episode  depressed, moderate ([place]) 01/04/2013   Generalized anxiety disorder 01/04/2013   Tobacco use disorder 01/04/2013    Past Surgical History:  Procedure Laterality Date   TONSILLECTOMY AND ADENOIDECTOMY      Family History  Problem Relation Age of Onset   Cancer Mother        FH maternal of esophegal cancer   Heart disease Father    Breast cancer Maternal Aunt 78   Uterine cancer Maternal Aunt 76   Heart disease Paternal Grandfather     Social History   Socioeconomic History   Marital status: Married    Spouse name: Not on file   Number of children: 0   Years of education: Not on file   Highest education level: Doctorate  Occupational History   Not on file  Tobacco Use   Smoking status: Every Day    Packs/day: 1.00    Years: 25.00    Total pack years: 25.00    Types: Cigarettes   Smokeless tobacco: Never  Vaping Use   Vaping Use: Never used  Substance and Sexual Activity   Alcohol use: Yes    Comment: 1-2 drinks per night wine   Drug use: Not on file    Comment: once a day   Sexual activity: Yes    Birth control/protection: Post-menopausal  Other Topics Concern   Not on file  Social History Narrative   Retired Chief Executive Officer   Social Determinants of Radio broadcast assistant Strain: Not on file  Food Insecurity: Not on file  Transportation Needs: Not on file  Physical Activity: Not on file  Stress: Not on file  Social Connections: Not on file  Intimate Partner Violence: Not on file    Outpatient Medications Prior to Visit  Medication Sig Dispense Refill   amLODipine (NORVASC) 10 MG tablet Take 10 mg by mouth daily.     ARIPiprazole (ABILIFY) 15 MG tablet Take 15 mg by mouth daily.     atorvastatin (LIPITOR) 20 MG tablet Take 20 mg by mouth daily.     gabapentin (NEURONTIN) 300 MG capsule Take 600 mg by mouth 2 (two) times daily.     lamoTRIgine (LAMICTAL) 200 MG tablet Take 200 mg by mouth 2 (two) times daily.     metFORMIN (GLUCOPHAGE) 1000 MG tablet Take  1,000 mg by mouth 2 (two) times daily with a meal.     omeprazole (PRILOSEC) 40 MG capsule Take 40 mg by mouth daily.     venlafaxine (EFFEXOR) 37.5 MG tablet Take 375 mg by mouth daily.     No facility-administered medications prior to visit.      ROS:  Review of Systems  Constitutional:  Negative for fever.  Gastrointestinal:  Negative for blood in stool, constipation, diarrhea, nausea and vomiting.  Genitourinary:  Positive for dyspareunia. Negative for dysuria, flank pain, frequency, hematuria, urgency, vaginal bleeding, vaginal discharge and vaginal pain.  Musculoskeletal:  Negative for back pain.  Skin:  Negative for rash.   BREAST: No symptoms   OBJECTIVE:   Vitals:  BP 118/64   Ht 5' 4.5" (1.638 m)   Wt 189 lb (85.7 kg)   LMP 05/21/2020 (Approximate)   BMI 31.94 kg/m   Physical Exam Vitals reviewed.  Constitutional:      Appearance: She is well-developed.  Pulmonary:     Effort: Pulmonary effort is normal.  Genitourinary:    General: Normal vulva.     Pubic Area: No rash.      Labia:        Right: No rash, tenderness or lesion.        Left: No rash, tenderness or lesion.      Vagina: Normal. No vaginal discharge, erythema, tenderness or bleeding.     Cervix: Normal.     Uterus: Normal. Not enlarged and not tender.      Adnexa: Right adnexa normal and left adnexa normal.       Right: No mass or tenderness.         Left: No mass or tenderness.       Comments: MOD VAGINAL ATROPHY ON EXAM; NO ERYTHEMA Musculoskeletal:        General: Normal range of motion.     Cervical back: Normal range of motion.  Skin:    General: Skin is warm and dry.  Neurological:     General: No focal deficit present.     Mental Status: She is alert and oriented to person, place, and time.  Psychiatric:        Mood and Affect: Mood normal.        Behavior: Behavior normal.        Thought Content: Thought content normal.        Judgment: Judgment normal.      Results: Results for orders placed or performed in visit on 05/18/22 (from the past 24 hour(s))  POCT Wet Prep with KOH     Status: Normal   Collection Time: 05/18/22  3:08 PM  Result Value Ref Range   Trichomonas, UA Negative  Clue Cells Wet Prep HPF POC neg    Epithelial Wet Prep HPF POC     Yeast Wet Prep HPF POC neg    Bacteria Wet Prep HPF POC     RBC Wet Prep HPF POC     WBC Wet Prep HPF POC     KOH Prep POC Negative Negative     Assessment/Plan: Dyspareunia, female - Plan: fluconazole (DIFLUCAN) 150 MG tablet, estradiol (ESTRACE) 0.1 MG/GM vaginal cream, POCT Wet Prep with KOH; pos sx and atrophy on exam. Neg wet prep. Since sx started after abx use, treat empirically with Rx diflucan in case any residual yeast. Also start vag ERT per pt pref. Change pads regularly to keep dry. F/u prn.   Postmenopausal atrophic vaginitis - Plan: estradiol (ESTRACE) 0.1 MG/GM vaginal cream; start vag ERT. Rx eRxd. Cont lubricants prn. F/u prn.   Cervical cancer screening - Plan: Cytology - PAP   Meds ordered this encounter  Medications   fluconazole (DIFLUCAN) 150 MG tablet    Sig: Take 1 tablet (150 mg total) by mouth once for 1 dose.    Dispense:  1 tablet    Refill:  0    Order Specific Question:   Supervising Provider    Answer:   Rubie Maid [[id number]]   estradiol (ESTRACE) 0.1 MG/GM vaginal cream    Sig: Insert 1g nightly for 1 wk, then 1 g once weekly as maintenace    Dispense:  42.5 g    Refill:  1    Order Specific Question:   Supervising Provider    Answer:   Rubie Maid [[id number]]      Return if symptoms worsen or fail to improve.  Khalik Pewitt B. Laycie Schriner, PA-C 05/18/2022 3:10 PM

## 2022-05-18 NOTE — Patient Instructions (Signed)
I value your feedback and you entrusting us with your care. If you get a [facility] patient survey, I would appreciate you taking the time to let us know about your experience today. Thank you! ? ? ?

## 2022-05-20 ENCOUNTER — Ambulatory Visit
Admission: RE | Admit: 2022-05-20 | Discharge: 2022-05-20 | Disposition: A | Discharge status: Home / Self Care | Payer: Medicare Other | Source: Ambulatory Visit | Attending: Nurse Practitioner | Admitting: Nurse Practitioner

## 2022-05-20 DIAGNOSIS — Z1231 Encounter for screening mammogram for malignant neoplasm of breast: Secondary | ICD-10-CM | POA: Diagnosis present

## 2022-05-21 LAB — CYTOLOGY - PAP
Adequacy: ABSENT
Diagnosis: NEGATIVE

## 2022-05-25 ENCOUNTER — Other Ambulatory Visit: Payer: Self-pay | Admitting: *Deleted

## 2022-05-25 ENCOUNTER — Other Ambulatory Visit: Payer: Self-pay | Admitting: Nurse Practitioner

## 2022-05-25 ENCOUNTER — Inpatient Hospital Stay
Admission: RE | Admit: 2022-05-25 | Discharge: 2022-05-25 | Disposition: A | Discharge status: Home / Self Care | Payer: Self-pay | Source: Ambulatory Visit | Attending: *Deleted | Admitting: *Deleted

## 2022-05-25 DIAGNOSIS — Z1231 Encounter for screening mammogram for malignant neoplasm of breast: Secondary | ICD-10-CM

## 2022-05-25 DIAGNOSIS — R928 Other abnormal and inconclusive findings on diagnostic imaging of breast: Secondary | ICD-10-CM

## 2022-05-25 DIAGNOSIS — R921 Mammographic calcification found on diagnostic imaging of breast: Secondary | ICD-10-CM

## 2022-05-26 ENCOUNTER — Ambulatory Visit (INDEPENDENT_AMBULATORY_CARE_PROVIDER_SITE_OTHER): Payer: Medicare Other | Admitting: *Deleted

## 2022-05-26 DIAGNOSIS — Z0000 Encounter for general adult medical examination without abnormal findings: Secondary | ICD-10-CM | POA: Diagnosis not present

## 2022-05-26 NOTE — Patient Instructions (Signed)
Rachael Mosley , Thank you for taking time to come for your Medicare Wellness Visit. I appreciate your ongoing commitment to your health goals. Please review the following plan we discussed and let me know if I can assist you in the future.   Screening recommendations/referrals: Colonoscopy: up to date Mammogram: up to date  Recommended yearly ophthalmology/optometry visit for glaucoma screening and checkup Recommended yearly dental visit for hygiene and checkup  Vaccinations: Influenza vaccine: up to date Pneumococcal vaccine: up to date Tdap vaccine: up to date Shingles vaccine: up to date    Advanced directives: Education provided  Conditions/risks identified:   Next appointment: 06-14-2022 @ 10:00 [place]   Preventive Care 40-64 Years and Older, Female Preventive care refers to lifestyle choices and visits with your health care provider that can promote health and wellness. What does preventive care include? A yearly physical exam. This is also called an annual well check. Dental exams once or twice a year. Routine eye exams. Ask your health care provider how often you should have your eyes checked. Personal lifestyle choices, including: Daily care of your teeth and gums. Regular physical activity. Eating a healthy diet. Avoiding tobacco and drug use. Limiting alcohol use. Practicing safe sex. Taking low-dose aspirin every day. Taking vitamin and mineral supplements as recommended by your health care provider. What happens during an annual well check? The services and screenings done by your health care provider during your annual well check will depend on your age, overall health, lifestyle risk factors, and family history of disease. Counseling  Your health care provider may ask you questions about your: Alcohol use. Tobacco use. Drug use. Emotional well-being. Home and relationship well-being. Sexual activity. Eating habits. History of falls. Memory and ability to  understand (cognition). Work and work Statistician. Reproductive health. Screening  You may have the following tests or measurements: Height, weight, and BMI. Blood pressure. Lipid and cholesterol levels. These may be checked every 5 years, or more frequently if you are over 50 years old. Skin check. Lung cancer screening. You may have this screening every year starting at age 43 if you have a 30-pack-year history of smoking and currently smoke or have quit within the past 15 years. Fecal occult blood test (FOBT) of the stool. You may have this test every year starting at age 63. Flexible sigmoidoscopy or colonoscopy. You may have a sigmoidoscopy every 5 years or a colonoscopy every 10 years starting at age 83. Hepatitis C blood test. Hepatitis B blood test. Sexually transmitted disease (STD) testing. Diabetes screening. This is done by checking your blood sugar (glucose) after you have not eaten for a while (fasting). You may have this done every 1-3 years. Bone density scan. This is done to screen for osteoporosis. You may have this done starting at age 63. Mammogram. This may be done every 1-2 years. Talk to your health care provider about how often you should have regular mammograms. Talk with your health care provider about your test results, treatment options, and if necessary, the need for more tests. Vaccines  Your health care provider may recommend certain vaccines, such as: Influenza vaccine. This is recommended every year. Tetanus, diphtheria, and acellular pertussis (Tdap, Td) vaccine. You may need a Td booster every 10 years. Zoster vaccine. You may need this after age 69. Pneumococcal 13-valent conjugate (PCV13) vaccine. One dose is recommended after age 71. Pneumococcal polysaccharide (PPSV23) vaccine. One dose is recommended after age 72. Talk to your health care provider about which screenings  and vaccines you need and how often you need them. This information is not  intended to replace advice given to you by your health care provider. Make sure you discuss any questions you have with your health care provider. Document Released: 10/03/2015 Document Revised: 05/26/2016 Document Reviewed: 07/08/2015 Elsevier Interactive Patient Education  2017 [place] Prevention in the Home Falls can cause injuries. They can happen to people of all ages. There are many things you can do to make your home safe and to help prevent falls. What can I do on the outside of my home? Regularly fix the edges of walkways and driveways and fix any cracks. Remove anything that might make you trip as you walk through a door, such as a raised step or threshold. Trim any bushes or trees on the path to your home. Use bright outdoor lighting. Clear any walking paths of anything that might make someone trip, such as rocks or tools. Regularly check to see if handrails are loose or broken. Make sure that both sides of any steps have handrails. Any raised decks and porches should have guardrails on the edges. Have any leaves, snow, or ice cleared regularly. Use sand or salt on walking paths during winter. Clean up any spills in your garage right away. This includes oil or grease spills. What can I do in the bathroom? Use night lights. Install grab bars by the toilet and in the tub and shower. Do not use towel bars as grab bars. Use non-skid mats or decals in the tub or shower. If you need to sit down in the shower, use a plastic, non-slip stool. Keep the floor dry. Clean up any water that spills on the floor as soon as it happens. Remove soap buildup in the tub or shower regularly. Attach bath mats securely with double-sided non-slip rug tape. Do not have throw rugs and other things on the floor that can make you trip. What can I do in the bedroom? Use night lights. Make sure that you have a light by your bed that is easy to reach. Do not use any sheets or blankets that are  too big for your bed. They should not hang down onto the floor. Have a firm chair that has side arms. You can use this for support while you get dressed. Do not have throw rugs and other things on the floor that can make you trip. What can I do in the kitchen? Clean up any spills right away. Avoid walking on wet floors. Keep items that you use a lot in easy-to-reach places. If you need to reach something above you, use a strong step stool that has a grab bar. Keep electrical cords out of the way. Do not use floor polish or wax that makes floors slippery. If you must use wax, use non-skid floor wax. Do not have throw rugs and other things on the floor that can make you trip. What can I do with my stairs? Do not leave any items on the stairs. Make sure that there are handrails on both sides of the stairs and use them. Fix handrails that are broken or loose. Make sure that handrails are as long as the stairways. Check any carpeting to make sure that it is firmly attached to the stairs. Fix any carpet that is loose or worn. Avoid having throw rugs at the top or bottom of the stairs. If you do have throw rugs, attach them to the floor with carpet tape.  Make sure that you have a light switch at the top of the stairs and the bottom of the stairs. If you do not have them, ask someone to add them for you. What else can I do to help prevent falls? Wear shoes that: Do not have high heels. Have rubber bottoms. Are comfortable and fit you well. Are closed at the toe. Do not wear sandals. If you use a stepladder: Make sure that it is fully opened. Do not climb a closed stepladder. Make sure that both sides of the stepladder are locked into place. Ask someone to hold it for you, if possible. Clearly mark and make sure that you can see: Any grab bars or handrails. First and last steps. Where the edge of each step is. Use tools that help you move around (mobility aids) if they are needed. These  include: Canes. Walkers. Scooters. Crutches. Turn on the lights when you go into a dark area. Replace any light bulbs as soon as they burn out. Set up your furniture so you have a clear path. Avoid moving your furniture around. If any of your floors are uneven, fix them. If there are any pets around you, be aware of where they are. Review your medicines with your doctor. Some medicines can make you feel dizzy. This can increase your chance of falling. Ask your doctor what other things that you can do to help prevent falls. This information is not intended to replace advice given to you by your health care provider. Make sure you discuss any questions you have with your health care provider. Document Released: 07/03/2009 Document Revised: 02/12/2016 Document Reviewed: 10/11/2014 Elsevier Interactive Patient Education  2017 [employer].

## 2022-05-26 NOTE — Progress Notes (Signed)
Subjective:   Rachael Mosley is a 56 y.o. female who presents for an Initial Medicare Annual Wellness Visit.  I connected with  Rachael Mosley on 05/26/22 by a telephone enabled telemedicine application and verified that I am speaking with the correct person using two identifiers.   I discussed the limitations of evaluation and management by telemedicine. The patient expressed understanding and agreed to proceed.  Patient location: home  Provider location: Tele-Health-home    Review of Systems     Cardiac Risk Factors include: advanced age (>39mn, >>66women);hypertension;smoking/ tobacco exposure;obesity (BMI >30kg/m2);family history of premature cardiovascular disease     Objective:    Today's Vitals   05/26/22 1156  PainSc: 4    There is no height or weight on file to calculate BMI.     05/26/2022   11:57 AM  Advanced Directives  Does Patient Have a Medical Advance Directive? No  Would patient like information on creating a medical advance directive? No - Patient declined    Current Medications (verified) Outpatient Encounter Medications as of 05/26/2022  Medication Sig   amLODipine (NORVASC) 10 MG tablet Take 10 mg by mouth daily.   ARIPiprazole (ABILIFY) 15 MG tablet Take 15 mg by mouth daily.   atorvastatin (LIPITOR) 20 MG tablet Take 20 mg by mouth daily.   estradiol (ESTRACE) 0.1 MG/GM vaginal cream Insert 1g nightly for 1 wk, then 1 g once weekly as maintenace   gabapentin (NEURONTIN) 300 MG capsule Take 600 mg by mouth 2 (two) times daily.   lamoTRIgine (LAMICTAL) 200 MG tablet Take 200 mg by mouth 2 (two) times daily.   metFORMIN (GLUCOPHAGE) 1000 MG tablet Take 1,000 mg by mouth 2 (two) times daily with a meal.   omeprazole (PRILOSEC) 40 MG capsule Take 40 mg by mouth daily.   venlafaxine (EFFEXOR) 37.5 MG tablet Take 375 mg by mouth daily.   No facility-administered encounter medications on file as of 05/26/2022.    Allergies (verified) Patient has no  known allergies.   History: Past Medical History:  Diagnosis Date   Anxiety    GERD (gastroesophageal reflux disease)    Hyperlipidemia    Hypertension    Past Surgical History:  Procedure Laterality Date   TONSILLECTOMY AND ADENOIDECTOMY     Family History  Problem Relation Age of Onset   Cancer Mother        FH maternal of esophegal cancer   Heart disease Father    Breast cancer Maternal Aunt 78   Uterine cancer Maternal Aunt 76   Heart disease Paternal Grandfather    Social History   Socioeconomic History   Marital status: Married    Spouse name: Not on file   Number of children: 0   Years of education: Not on file   Highest education level: Doctorate  Occupational History   Not on file  Tobacco Use   Smoking status: Every Day    Packs/day: 1.00    Years: 25.00    Total pack years: 25.00    Types: Cigarettes   Smokeless tobacco: Never  Vaping Use   Vaping Use: Never used  Substance and Sexual Activity   Alcohol use: Yes    Comment: 1-2 drinks per night wine   Drug use: Not on file    Comment: once a day   Sexual activity: Yes    Birth control/protection: Post-menopausal  Other Topics Concern   Not on file  Social History Narrative   Retired lChief Executive Officer  Social  Determinants of Health   Financial Resource Strain: Low Risk  (05/26/2022)   Overall Financial Resource Strain (CARDIA)    Difficulty of Paying Living Expenses: Not hard at all  Food Insecurity: No Food Insecurity (05/26/2022)   Hunger Vital Sign    Worried About Running Out of Food in the Last Year: Never true    Ran Out of Food in the Last Year: Never true  Transportation Needs: No Transportation Needs (05/26/2022)   PRAPARE - Hydrologist (Medical): No    Lack of Transportation (Non-Medical): No  Physical Activity: Insufficiently Active (05/26/2022)   Exercise Vital Sign    Days of Exercise per Week: 2 days    Minutes of Exercise per Session: 30 min  Stress: Stress  Concern Present (05/26/2022)   [place]    Feeling of Stress : To some extent  Social Connections: Socially Integrated (05/26/2022)   Social Connection and Isolation Panel [NHANES]    Frequency of Communication with Friends and Family: More than three times a week    Frequency of Social Gatherings with Friends and Family: Never    Attends Religious Services: More than 4 times per year    Active Member of [employer] or Organizations: Yes    Attends Music therapist: More than 4 times per year    Marital Status: Married    Tobacco Counseling Ready to quit: Not Answered Counseling given: Not Answered   Clinical Intake:  Pre-visit preparation completed: Yes  Pain : 0-10 Pain Score: 4  Pain Type: Chronic pain Pain Location: Knee Pain Orientation: Left, Right Pain Descriptors / Indicators: Aching, Discomfort, Dull Pain Onset: More than a month ago Pain Frequency: Intermittent     Diabetes: No  How often do you need to have someone help you when you read instructions, pamphlets, or other written materials from your doctor or pharmacy?: 1 - Never  Diabetic?  no  Interpreter Needed?: No  Information entered by :: Leroy Kennedy LPN   Activities of Daily Living    05/26/2022   12:18 PM  In your present state of health, do you have any difficulty performing the following activities:  Hearing? 0  Vision? 0  Difficulty concentrating or making decisions? 1  Walking or climbing stairs? 0  Dressing or bathing? 0  Doing errands, shopping? 0  Preparing Food and eating ? N  Using the Toilet? N  In the past six months, have you accidently leaked urine? N  Do you have problems with loss of bowel control? N  Managing your Medications? N  Managing your Finances? N  Housekeeping or managing your Housekeeping? N    Patient Care Team: Rachael Pitcher, NP as PCP - General (Nurse Practitioner)  Indicate any  recent Medical Services you may have received from other than [facility] providers in the past year (date may be approximate).     Assessment:   This is a routine wellness examination for Rachael Mosley.  Hearing/Vision screen Hearing Screening - Comments:: No trouble hearing Vision Screening - Comments:: Up to date Rachael Mosley  Dietary issues and exercise activities discussed: Current Exercise Habits: Home exercise routine, Time (Minutes): 20, Frequency (Times/Week): 3, Weekly Exercise (Minutes/Week): 60, Intensity: Mild   Goals Addressed             This Visit's Progress    Weight (lb) < 200 lb (90.7 kg)         Depression Screen  05/26/2022   12:05 PM 03/05/2022    3:42 PM  PHQ 2/9 Scores  PHQ - 2 Score 2 6  PHQ- 9 Score 13     Fall Risk    05/26/2022   11:58 AM 03/05/2022    3:42 PM  Fall Risk   Falls in the past year? 0 0  Number falls in past yr: 0 0  Injury with Fall? 0 0  Follow up Falls evaluation completed;Education provided;Falls prevention discussed     FALL RISK PREVENTION PERTAINING TO THE HOME:  Any stairs in or around the home? No  If so, are there any without handrails? No  Home free of loose throw rugs in walkways, pet beds, electrical cords, etc? Yes  Adequate lighting in your home to reduce risk of falls? Yes   ASSISTIVE DEVICES UTILIZED TO PREVENT FALLS:  Life alert? No  Use of a cane, walker or w/c? No  Grab bars in the bathroom? No  Shower chair or bench in shower? No  Elevated toilet seat or a handicapped toilet? No   TIMED UP AND GO:  Was the test performed? No .    Cognitive Function:        05/26/2022   12:00 PM  6CIT Screen  What Year? 0 points  What month? 0 points  What time? 0 points  Count back from 20 0 points  Months in reverse 0 points  Repeat phrase 0 points  Total Score 0 points    Immunizations Immunization History  Administered Date(s) Administered   PFIZER(Purple Top)SARS-COV-2 Vaccination 12/13/2019, 01/03/2020,  08/11/2020   Pfizer Covid-19 Vaccine Bivalent Booster 76yr & up 08/11/2020, 12/24/2020, 06/24/2021, 01/28/2022   Pneumococcal Polysaccharide-23 03/02/2018   Tdap 05/23/2010, 12/06/2011, 12/03/2018    TDAP status: Up to date  Flu Vaccine status: Up to date  Pneumococcal vaccine status: Up to date  Covid-19 vaccine status: Information provided on how to obtain vaccines.   Qualifies for Shingles Vaccine? Yes   Zostavax completed No   Shingrix Completed?: Yes  Screening Tests Health Maintenance  Topic Date Due   Zoster Vaccines- Shingrix (1 of 2) Never done   COVID-19 Vaccine (8 - Pfizer risk series) 03/25/2022   INFLUENZA VACCINE  Never done   MAMMOGRAM  05/20/2024   PAP SMEAR-Modifier  05/19/2027   TETANUS/TDAP  12/02/2028   COLONOSCOPY (Pts 45-479yrInsurance coverage will need to be confirmed)  07/03/2030   Hepatitis C Screening  Completed   HIV Screening  Completed   HPV VACCINES  Aged Out    Health Maintenance  Health Maintenance Due  Topic Date Due   Zoster Vaccines- Shingrix (1 of 2) Never done   COVID-19 Vaccine (8 - Pfizer risk series) 03/25/2022   INFLUENZA VACCINE  Never done    Colorectal cancer screening: Type of screening: Colonoscopy. Completed 10. Repeat every 10 years  Mammogram status: Completed  . Repeat every year    Lung Cancer Screening: (Low Dose CT Chest recommended if Age 54-80ears, 30 pack-year currently smoking OR have quit w/in 15years.) does qualify.   Lung Cancer Screening Referral: completed 2023  Additional Screening:  Hepatitis C Screening: does not qualify; Completed 2021  Vision Screening: Recommended annual ophthalmology exams for early detection of glaucoma and other disorders of the eye. Is the patient up to date with their annual eye exam?  Yes  Who is the provider or what is the name of the office in which the patient attends annual eye exams? HeHerbert Deanerf pt  is not established with a provider, would they like to be  referred to a provider to establish care? No .   Dental Screening: Recommended annual dental exams for proper oral hygiene  Community Resource Referral / Chronic Care Management: CRR required this visit?  No   CCM required this visit?  No      Plan:     I have personally reviewed and noted the following in the patient's chart:   Medical and social history Use of alcohol, tobacco or illicit drugs  Current medications and supplements including opioid prescriptions. Patient is not currently taking opioid prescriptions. Functional ability and status Nutritional status Physical activity Advanced directives List of other physicians Hospitalizations, surgeries, and ER visits in previous 12 months Vitals Screenings to include cognitive, depression, and falls Referrals and appointments  In addition, I have reviewed and discussed with patient certain preventive protocols, quality metrics, and best practice recommendations. A written personalized care plan for preventive services as well as general preventive health recommendations were provided to patient.     Leroy Kennedy, LPN   6/0/1561   Nurse Notes:

## 2022-05-28 ENCOUNTER — Encounter: Payer: Self-pay | Admitting: Nurse Practitioner

## 2022-05-28 DIAGNOSIS — Z1231 Encounter for screening mammogram for malignant neoplasm of breast: Secondary | ICD-10-CM

## 2022-05-28 NOTE — Telephone Encounter (Signed)
Pt called in requesting  her referral for a mammogram be sent to [facility]  . Please advise [phone]

## 2022-05-28 NOTE — Addendum Note (Signed)
Addended by: Michela Pitcher on: 05/28/2022 04:21 PM   Modules accepted: Orders

## 2022-05-28 NOTE — Telephone Encounter (Signed)
Can we update her order to [facility]?

## 2022-06-01 ENCOUNTER — Ambulatory Visit: Payer: Medicare Other | Admitting: Podiatry

## 2022-06-02 ENCOUNTER — Encounter: Payer: Self-pay | Admitting: Obstetrics and Gynecology

## 2022-06-02 DIAGNOSIS — R102 Pelvic and perineal pain: Secondary | ICD-10-CM

## 2022-06-02 DIAGNOSIS — N941 Unspecified dyspareunia: Secondary | ICD-10-CM

## 2022-06-03 ENCOUNTER — Encounter (INDEPENDENT_AMBULATORY_CARE_PROVIDER_SITE_OTHER): Payer: Self-pay | Admitting: Family Medicine

## 2022-06-03 ENCOUNTER — Ambulatory Visit (INDEPENDENT_AMBULATORY_CARE_PROVIDER_SITE_OTHER): Payer: Medicare Other | Admitting: Family Medicine

## 2022-06-03 VITALS — BP 125/77 | HR 80 | Temp 98.1°F | Ht 64.0 in | Wt 181.0 lb

## 2022-06-03 DIAGNOSIS — I10 Essential (primary) hypertension: Secondary | ICD-10-CM

## 2022-06-03 DIAGNOSIS — E669 Obesity, unspecified: Secondary | ICD-10-CM | POA: Diagnosis not present

## 2022-06-03 DIAGNOSIS — Z6831 Body mass index (BMI) 31.0-31.9, adult: Secondary | ICD-10-CM

## 2022-06-03 DIAGNOSIS — F39 Unspecified mood [affective] disorder: Secondary | ICD-10-CM | POA: Diagnosis not present

## 2022-06-03 DIAGNOSIS — E66811 Obesity, class 1: Secondary | ICD-10-CM

## 2022-06-03 DIAGNOSIS — E8881 Metabolic syndrome: Secondary | ICD-10-CM

## 2022-06-03 MED ORDER — METFORMIN HCL 1000 MG PO TABS: 1000.0000 mg | ORAL_TABLET | Freq: Two times a day (BID) | ORAL | 0 refills | Status: DC

## 2022-06-07 ENCOUNTER — Ambulatory Visit: Payer: Medicare Other | Admitting: Nurse Practitioner

## 2022-06-08 NOTE — Progress Notes (Signed)
Chief Complaint:   OBESITY Rachael Mosley is here to discuss her progress with her obesity treatment plan along with follow-up of her obesity related diagnoses. Rachael Mosley is on the Category 3 Plan and states she is following her eating plan approximately 80% of the time. Rachael Mosley states she is doing some.  Today's visit was #: 3 Starting weight: 188 lbs Starting date: 04/20/2022 Today's weight: 181 lbs Today's date: 06/03/2022 Total lbs lost to date: 7 lbs Total lbs lost since last in-office visit: 10 lbs  Interim History: doing better without meal skipping.  Good support system.  Has another trip at the end of the month.  Ice cream is a temptation. Plans to start riding her stationary bike.  Staying off SSB's.   Subjective:   1. Metabolic syndrome Starting to see a reduction in visceral fat rating.   2. Essential hypertension Blood pressure at goal.  On amlodipine 10 mg daily.   3. Mood disorder (HCC) Stable mood, managed by psychology. On Abilify and gabapentin which may contribute to weight gain.  Also, on Lamictal and Effexor.   Assessment/Plan:   1. Metabolic syndrome Refill - metFORMIN (GLUCOPHAGE) 1000 MG tablet; Take 1 tablet (1,000 mg total) by mouth 2 (two) times daily with a meal.  Dispense: 180 tablet; Refill: 0  2. Essential hypertension Continue heart healthy diet, plan for weight loss and amlodipine 10 mg daily.   3. Mood disorder ([place]) Continue current plan of care per psychology.   4. Obesity,current BMI 31.2 1) Easy Recipe Handout Given.   2) Discussed snack options to bring on upcoming trip.   Rachael Mosley is currently in the action stage of change. As such, her goal is to continue with weight loss efforts. She has agreed to the Category 3 Plan + 80-100 protein daily.   Exercise goals:  check out local hiking trails.   Behavioral modification strategies: increasing lean protein intake, increasing vegetables, increasing water intake, decreasing eating out, no  skipping meals, meal planning and cooking strategies, better snacking choices, and decreasing junk food.  Rachael Mosley has agreed to follow-up with our clinic in 4 weeks. She was informed of the importance of frequent follow-up visits to maximize her success with intensive lifestyle modifications for her multiple health conditions.   Objective:   Blood pressure 125/77, pulse 80, temperature 98.1 F (36.7 C), height '5\' 4"'$  (1.626 m), weight 181 lb (82.1 kg), last menstrual period 05/21/2020, SpO2 95 %. Body mass index is 31.07 kg/m.  General: Cooperative, alert, well developed, in no acute distress. HEENT: Conjunctivae and lids unremarkable. Cardiovascular: Regular rhythm.  Lungs: Normal work of breathing. Neurologic: No focal deficits.   Lab Results  Component Value Date   CREATININE 0.51 03/11/2022   BUN 11 03/11/2022   NA 140 03/11/2022   K 4.3 03/11/2022   CL 105 03/11/2022   CO2 26 03/11/2022   Lab Results  Component Value Date   ALT 12 03/11/2022   AST 14 03/11/2022   ALKPHOS 91 03/11/2022   BILITOT 0.2 03/11/2022   Lab Results  Component Value Date   HGBA1C 6.2 03/11/2022   No results found for: "INSULIN" Lab Results  Component Value Date   TSH 1.22 03/11/2022   Lab Results  Component Value Date   CHOL 151 03/11/2022   HDL 41.10 03/11/2022   LDLDIRECT 73.0 03/11/2022   TRIG 313.0 (H) 03/11/2022   CHOLHDL 4 03/11/2022   Lab Results  Component Value Date   VD25OH 33.2 04/20/2022  Lab Results  Component Value Date   WBC 15.0 (H) 03/11/2022   HGB 14.6 03/11/2022   HCT 45.0 03/11/2022   MCV 89.3 03/11/2022   PLT 269.0 03/11/2022   No results found for: "IRON", "TIBC", "FERRITIN"  Obesity Behavioral Intervention:   Approximately 15 minutes were spent on the discussion below.  ASK: We discussed the diagnosis of obesity with Curt Bears today and Oona agreed to give [place] permission to discuss obesity behavioral modification therapy today.  ASSESS: Pattricia  has the diagnosis of obesity and her BMI today is 31.2. Sanaiya is in the action stage of change.   ADVISE: Hildagard was educated on the multiple health risks of obesity as well as the benefit of weight loss to improve her health. She was advised of the need for long term treatment and the importance of lifestyle modifications to improve her current health and to decrease her risk of future health problems.  AGREE: Multiple dietary modification options and treatment options were discussed and Teondra agreed to follow the recommendations documented in the above note.  ARRANGE: Haidee was educated on the importance of frequent visits to treat obesity as outlined per CMS and USPSTF guidelines and agreed to schedule her next follow up appointment today.  Attestation Statements:   Reviewed by clinician on day of visit: allergies, medications, problem list, medical history, surgical history, family history, social history, and previous encounter notes.  I, Davy Pique, am acting as Location manager for Loyal Gambler, DO.  I have reviewed the above documentation for accuracy and completeness, and I agree with the above. Dell Ponto, DO

## 2022-06-10 ENCOUNTER — Ambulatory Visit
Admission: RE | Admit: 2022-06-10 | Discharge: 2022-06-10 | Disposition: A | Discharge status: Home / Self Care | Payer: Medicare Other | Source: Ambulatory Visit | Attending: Nurse Practitioner | Admitting: Nurse Practitioner

## 2022-06-10 ENCOUNTER — Encounter: Payer: Self-pay | Admitting: Nurse Practitioner

## 2022-06-10 DIAGNOSIS — R921 Mammographic calcification found on diagnostic imaging of breast: Secondary | ICD-10-CM | POA: Insufficient documentation

## 2022-06-10 DIAGNOSIS — R928 Other abnormal and inconclusive findings on diagnostic imaging of breast: Secondary | ICD-10-CM | POA: Diagnosis present

## 2022-06-11 ENCOUNTER — Other Ambulatory Visit: Payer: Self-pay | Admitting: Nurse Practitioner

## 2022-06-11 DIAGNOSIS — R921 Mammographic calcification found on diagnostic imaging of breast: Secondary | ICD-10-CM

## 2022-06-11 DIAGNOSIS — R928 Other abnormal and inconclusive findings on diagnostic imaging of breast: Secondary | ICD-10-CM

## 2022-06-14 ENCOUNTER — Telehealth: Payer: Self-pay

## 2022-06-14 ENCOUNTER — Ambulatory Visit: Payer: Medicare Other

## 2022-06-14 ENCOUNTER — Ambulatory Visit: Payer: Medicare Other | Admitting: Obstetrics and Gynecology

## 2022-06-14 ENCOUNTER — Ambulatory Visit (INDEPENDENT_AMBULATORY_CARE_PROVIDER_SITE_OTHER): Payer: Medicare Other | Admitting: Nurse Practitioner

## 2022-06-14 ENCOUNTER — Telehealth: Payer: Self-pay | Admitting: Nurse Practitioner

## 2022-06-14 VITALS — BP 130/76 | HR 92 | Temp 95.9°F | Resp 14 | Ht 64.0 in | Wt 181.4 lb

## 2022-06-14 DIAGNOSIS — K219 Gastro-esophageal reflux disease without esophagitis: Secondary | ICD-10-CM

## 2022-06-14 DIAGNOSIS — E8881 Metabolic syndrome: Secondary | ICD-10-CM | POA: Diagnosis not present

## 2022-06-14 DIAGNOSIS — S91302D Unspecified open wound, left foot, subsequent encounter: Secondary | ICD-10-CM | POA: Diagnosis not present

## 2022-06-14 DIAGNOSIS — R928 Other abnormal and inconclusive findings on diagnostic imaging of breast: Secondary | ICD-10-CM | POA: Diagnosis not present

## 2022-06-14 MED ORDER — FAMOTIDINE 20 MG PO TABS: 20.0000 mg | ORAL_TABLET | Freq: Every day | ORAL | 0 refills | Status: DC

## 2022-06-14 NOTE — Progress Notes (Deleted)
Michela Pitcher, NP   No chief complaint on file.   HPI:      Rachael Mosley is a 56 y.o. G0P0000 whose LMP was Patient's last menstrual period was 05/21/2020 (approximate)., presents today for ***    Patient Active Problem List   Diagnosis Date Noted   Abnormal mammogram 06/14/2022   Mood disorder ([place]) 02/63/7858   Metabolic syndrome 85/10/7739   Bipolar disorder ([place]) 05/11/2022   Essential hypertension 05/11/2022   Prediabetes 05/11/2022   Vitamin D deficiency 05/11/2022   Depression 05/11/2022   Open wound of left foot excluding one or more toes 03/05/2022   Pre-diabetes 03/05/2022   Class 1 drug-induced obesity with serious comorbidity and body mass index (BMI) of 32.0 to 32.9 in adult 03/05/2022   Glaucoma 01/04/2022   Alcohol dependence with unspecified alcohol-induced disorder ([place]) 05/20/2020   Primary hypertension 04/08/2020   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   Mixed stress and urge urinary incontinence 11/30/2018   Hyperlipidemia 03/02/2018   Dyspareunia, female 08/03/2017   Family history of esophageal cancer 08/03/2017   Allergic rhinitis 10/25/2014   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/2014   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   Insomnia 07/23/2013   PTSD (post-traumatic stress disorder) 05/29/2013   Bipolar disorder, current episode depressed, moderate ([place]) 01/04/2013   Generalized anxiety disorder 01/04/2013   Tobacco use disorder 01/04/2013    Past Surgical History:  Procedure Laterality Date   TONSILLECTOMY AND ADENOIDECTOMY      Family History  Problem Relation Age of Onset   Cancer Mother        FH maternal of esophegal cancer   Heart disease Father    Breast cancer Maternal Aunt 78   Uterine cancer Maternal Aunt 76   Heart disease Paternal Grandfather     Social History   Socioeconomic History   Marital status: Married    Spouse name: Not on file   Number of children: 0   Years of education: Not on file    Highest education level: Doctorate  Occupational History   Not on file  Tobacco Use   Smoking status: Every Day    Packs/day: 1.00    Years: 25.00    Total pack years: 25.00    Types: Cigarettes   Smokeless tobacco: Never  Vaping Use   Vaping Use: Never used  Substance and Sexual Activity   Alcohol use: Yes    Comment: 1-2 drinks per night wine   Drug use: Not on file    Comment: once a day   Sexual activity: Yes    Birth control/protection: Post-menopausal  Other Topics Concern   Not on file  Social History Narrative   Retired Chief Executive Officer   Social Determinants of Health   Financial Resource Strain: [place]  (05/26/2022)   Overall Financial Resource Strain (CARDIA)    Difficulty of Paying Living Expenses: Not hard at all  Food Insecurity: No Food Insecurity (05/26/2022)   Hunger Vital Sign    Worried About Running Out of Food in the Last Year: Never true    Ran Out of Food in the Last Year: Never true  Transportation Needs: No Transportation Needs (05/26/2022)   PRAPARE - Hydrologist (Medical): No    Lack of Transportation (Non-Medical): No  Physical Activity: Insufficiently Active (05/26/2022)   Exercise Vital Sign    Days of Exercise per Week: 2 days    Minutes of Exercise per Session: 30 min  Stress: Stress  Concern Present (05/26/2022)   [place]    Feeling of Stress : To some extent  Social Connections: Socially Integrated (05/26/2022)   Social Connection and Isolation Panel [NHANES]    Frequency of Communication with Friends and Family: More than three times a week    Frequency of Social Gatherings with Friends and Family: Never    Attends Religious Services: More than 4 times per year    Active Member of [employer] or Organizations: Yes    Attends Music therapist: More than 4 times per year    Marital Status: Married  Human resources officer Violence: Not At Risk (05/26/2022)    Humiliation, Afraid, Rape, and Kick questionnaire    Fear of Current or Ex-Partner: No    Emotionally Abused: No    Physically Abused: No    Sexually Abused: No    Outpatient Medications Prior to Visit  Medication Sig Dispense Refill   amLODipine (NORVASC) 10 MG tablet Take 10 mg by mouth daily.     ARIPiprazole (ABILIFY) 15 MG tablet Take 15 mg by mouth daily.     atorvastatin (LIPITOR) 20 MG tablet Take 20 mg by mouth daily.     estradiol (ESTRACE) 0.1 MG/GM vaginal cream Insert 1g nightly for 1 wk, then 1 g once weekly as maintenace 42.5 g 1   famotidine (PEPCID) 20 MG tablet Take 1 tablet (20 mg total) by mouth at bedtime. 15 tablet 0   gabapentin (NEURONTIN) 300 MG capsule Take 600 mg by mouth 2 (two) times daily.     lamoTRIgine (LAMICTAL) 200 MG tablet Take 200 mg by mouth 2 (two) times daily.     metFORMIN (GLUCOPHAGE) 1000 MG tablet Take 1 tablet (1,000 mg total) by mouth 2 (two) times daily with a meal. 180 tablet 0   omeprazole (PRILOSEC) 40 MG capsule Take 40 mg by mouth daily.     venlafaxine (EFFEXOR) 37.5 MG tablet Take 375 mg by mouth daily.     No facility-administered medications prior to visit.      ROS:  Review of Systems BREAST: No symptoms   OBJECTIVE:   Vitals:  LMP 05/21/2020 (Approximate)   Physical Exam  Results: No results found for this or any previous visit (from the past 24 hour(s)).   Assessment/Plan: No diagnosis found.    No orders of the defined types were placed in this encounter.     No follow-ups on file.  Ebone Alcivar B. Atiyana Welte, PA-C 06/14/2022 11:21 AM

## 2022-06-14 NOTE — Progress Notes (Signed)
Established Patient Office Visit  Subjective   Patient ID: Rachael Mosley, female    DOB: 08-Feb-1966  Age: 56 y.o. MRN: [id number]  Chief Complaint  Patient presents with   Follow-up    3 months    HPI  Obesity: Health weight and wellness has lost 10 pounds so far.  Patient is being followed by Dr. Loyal Gambler.  Did review most recent labs drawn by her.  Toe wound: Was sent to podiatry and seen by Dr. Boneta Lucks.  Patient states he did give he an injection in her great toe.  The wound is still present.  No signs of infection today  Breast biopsy: States that she had a mammogram at the [facility] breast center and found that she needed further imaging.  Patient was not happy with the care she received to like to get it done at [facility] breast imaging center.  States that it is scheduled for this coming Friday.  GERD: states that it has been increased for the past month. States she f Has had endoscopy  in 2019 and recall in 5 years. States strong history of esophageal cancer. States it is worse at night. In the middle of the night. States that when she takes the Tums it helps some. No history of h pylori.States that her last snack is around 8-9 p.m. and she goes to bed around 930 to 10 PM.      Review of Systems  Constitutional:  Negative for chills and fever.  Respiratory:  Negative for shortness of breath.   Cardiovascular:  Negative for chest pain.  Gastrointestinal:  Positive for abdominal pain and heartburn. Negative for nausea and vomiting.      Objective:     BP 130/76   Pulse 92   Temp (!) 95.9 F (35.5 C)   Resp 14   Ht '5\' 4"'$  (1.626 m)   Wt 181 lb 6 oz (82.3 kg)   LMP 05/21/2020 (Approximate)   SpO2 96%   BMI 31.13 kg/m    Physical Exam Vitals and nursing note reviewed.  Constitutional:      Appearance: Normal appearance.  Cardiovascular:     Rate and Rhythm: Normal rate and regular rhythm.     Heart sounds: Normal heart sounds.  Pulmonary:     Effort:  Pulmonary effort is normal.     Breath sounds: Normal breath sounds.  Abdominal:     General: Bowel sounds are normal. There is no distension.     Palpations: There is no mass.     Tenderness: There is no abdominal tenderness.     Hernia: No hernia is present.  Musculoskeletal:     Right lower leg: No edema.     Left lower leg: No edema.  Skin:      Neurological:     Mental Status: She is alert.      No results found for any visits on 06/14/22.    The 10-year ASCVD risk score (Arnett DK, et al., 2019) is: 5.8%    Assessment & Plan:   Problem List Items Addressed This Visit       Respiratory   LPRD (laryngopharyngeal reflux disease)    History of GERD.  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  Patient has had an endoscopy and approximately 2019.  As of late reflux has increased did encourage patient to move her snack up earlier in the day prior to bedtime also sleeping with an extra pillow to help keep her  elevated.  She can continue taking omeprazole 40 mg daily and will add Pepcid 20 mg nightly for 2 weeks.  Ambulatory referral to GI for further management and possible endoscopy.      Relevant Medications   famotidine (PEPCID) 20 MG tablet   Other Relevant Orders   Ambulatory referral to Gastroenterology     Other   Open wound of left foot excluding one or more toes    Was evaluated by podiatry.  We will send patient to wound care clinic as wound is still open      Relevant Orders   AMB referral to wound care center   Metabolic syndrome - Primary    Patient is currently managed by healthy weight and wellness Dr. Loyal Gambler.  Patient still on metformin for hyperglycemia secondary to antipsychotic medications.      Abnormal mammogram    Patient got mammogram 06/10/2022 did show abnormalities in left breast.  Patient is having biopsy.  This is scheduled for this Friday, 06/18/2022 at [facility] breast center.  Medical assistant has called to ensure they  do not need any orders or anything from [place].  Waiting for a phone call back from facility.       Return in about 6 months (around 12/13/2022) for CPE and labs.    Romilda Garret, NP

## 2022-06-14 NOTE — Assessment & Plan Note (Signed)
Patient got mammogram 06/10/2022 did show abnormalities in left breast.  Patient is having biopsy.  This is scheduled for this Friday, 06/18/2022 at [facility] breast center.  Medical assistant has called to ensure they do not need any orders or anything from [place].  Waiting for a phone call back from facility.

## 2022-06-14 NOTE — Assessment & Plan Note (Signed)
Was evaluated by podiatry.  We will send patient to wound care clinic as wound is still open

## 2022-06-14 NOTE — Telephone Encounter (Signed)
Orders received via fax and faxed back to [place] and [place] locations.

## 2022-06-14 NOTE — Telephone Encounter (Signed)
Patient is scheduled to have left breast biopsy at [facility] on [address] in [place] for 06/18/22. I called their office at [phone] and left a message asking them to send [place] orders to sign or call back to let us know if we need to send them orders and the specifics for it. Also need a fax number.

## 2022-06-14 NOTE — Telephone Encounter (Signed)
This has been done. See mychart message to the patient

## 2022-06-14 NOTE — Telephone Encounter (Signed)
Patient called and stated that biospy need a signature and wanted a call back. Call back number [phone].

## 2022-06-14 NOTE — Patient Instructions (Signed)
Nice to see you today I added on pepcid Work on the interventions we spoke about Follow up with me in 6 months for your physical, sooner if you need me

## 2022-06-14 NOTE — Assessment & Plan Note (Signed)
History of GERD.  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  Patient has had an endoscopy and approximately 2019.  As of late reflux has increased did encourage patient to move her snack up earlier in the day prior to bedtime also sleeping with an extra pillow to help keep her elevated.  She can continue taking omeprazole 40 mg daily and will add Pepcid 20 mg nightly for 2 weeks.  Ambulatory referral to GI for further management and possible endoscopy.

## 2022-06-14 NOTE — Assessment & Plan Note (Signed)
Patient is currently managed by healthy weight and wellness Dr. Loyal Gambler.  Patient still on metformin for hyperglycemia secondary to antipsychotic medications.

## 2022-06-16 ENCOUNTER — Ambulatory Visit: Payer: Medicare Other | Admitting: Family Medicine

## 2022-06-21 ENCOUNTER — Other Ambulatory Visit (HOSPITAL_COMMUNITY)
Admission: RE | Admit: 2022-06-21 | Discharge: 2022-06-21 | Disposition: A | Discharge status: Home / Self Care | Payer: Medicare Other | Source: Ambulatory Visit | Attending: Obstetrics and Gynecology | Admitting: Obstetrics and Gynecology

## 2022-06-21 ENCOUNTER — Ambulatory Visit (INDEPENDENT_AMBULATORY_CARE_PROVIDER_SITE_OTHER): Payer: Medicare Other

## 2022-06-21 VITALS — BP 126/85 | HR 86 | Wt 180.0 lb

## 2022-06-21 DIAGNOSIS — R102 Pelvic and perineal pain: Secondary | ICD-10-CM | POA: Insufficient documentation

## 2022-06-23 ENCOUNTER — Other Ambulatory Visit: Payer: Self-pay | Admitting: Nurse Practitioner

## 2022-06-23 DIAGNOSIS — K219 Gastro-esophageal reflux disease without esophagitis: Secondary | ICD-10-CM

## 2022-06-23 LAB — CERVICOVAGINAL ANCILLARY ONLY
Bacterial Vaginitis (gardnerella): POSITIVE — AB
Candida Glabrata: NEGATIVE
Candida Vaginitis: NEGATIVE
Chlamydia: NEGATIVE
Comment: NEGATIVE
Comment: NEGATIVE
Comment: NEGATIVE
Comment: NEGATIVE
Comment: NEGATIVE
Comment: NORMAL
Neisseria Gonorrhea: NEGATIVE
Trichomonas: NEGATIVE

## 2022-06-24 MED ORDER — METRONIDAZOLE 500 MG PO TABS: ORAL_TABLET | ORAL | 0 refills | Status: DC

## 2022-06-24 NOTE — Addendum Note (Signed)
Addended by: Ardeth Perfect B on: 78/03/1835 10:18 PM   Modules accepted: Orders

## 2022-06-27 ENCOUNTER — Other Ambulatory Visit: Payer: Self-pay | Admitting: Nurse Practitioner

## 2022-06-27 DIAGNOSIS — K219 Gastro-esophageal reflux disease without esophagitis: Secondary | ICD-10-CM

## 2022-06-27 NOTE — Progress Notes (Unsigned)
    Rachael Mosley [name], MD, [place] at [facility] [place], [address]  Phone: [phone]  FAX: [place] - 56 y.o. female  MRN [id number]  Date of Birth: 1965-11-01  Date: 06/28/2022  PCP: Michela Pitcher, NP  Referral: Michela Pitcher, NP  No chief complaint on file.  Subjective:   Rachael Mosley is a 56 y.o. very pleasant female patient with There is no height or weight on file to calculate BMI. who presents with the following:  Pleasant young patient presents with some ongoing thumb pain.    Review of Systems is noted in the HPI, as appropriate  Objective:   LMP 05/21/2020 (Approximate)   GEN: No acute distress; alert,appropriate. PULM: Breathing comfortably in no respiratory distress PSYCH: Normally interactive.   Laboratory and Imaging Data:  Assessment and Plan:   ***

## 2022-06-28 ENCOUNTER — Encounter: Payer: Self-pay | Admitting: Family Medicine

## 2022-06-28 ENCOUNTER — Ambulatory Visit (INDEPENDENT_AMBULATORY_CARE_PROVIDER_SITE_OTHER): Payer: Medicare Other | Admitting: Family Medicine

## 2022-06-28 VITALS — BP 90/60 | HR 98 | Temp 98.8°F | Ht 64.0 in | Wt 181.0 lb

## 2022-06-28 DIAGNOSIS — M19041 Primary osteoarthritis, right hand: Secondary | ICD-10-CM | POA: Diagnosis not present

## 2022-06-28 DIAGNOSIS — M19042 Primary osteoarthritis, left hand: Secondary | ICD-10-CM | POA: Diagnosis not present

## 2022-06-28 MED ORDER — TRIAMCINOLONE ACETONIDE 40 MG/ML IJ SUSP
20.0000 mg | Freq: Once | INTRAMUSCULAR | Status: AC
  Administered 2022-06-28: 20 mg via INTRA_ARTICULAR

## 2022-06-28 NOTE — Addendum Note (Signed)
Addended by: Carter Kitten on: 06/28/2022 10:52 AM   Modules accepted: Orders

## 2022-07-03 ENCOUNTER — Encounter: Payer: Self-pay | Admitting: Nurse Practitioner

## 2022-07-03 DIAGNOSIS — E785 Hyperlipidemia, unspecified: Secondary | ICD-10-CM

## 2022-07-03 DIAGNOSIS — I10 Essential (primary) hypertension: Secondary | ICD-10-CM

## 2022-07-05 ENCOUNTER — Ambulatory Visit (INDEPENDENT_AMBULATORY_CARE_PROVIDER_SITE_OTHER): Payer: Medicare Other | Admitting: Family Medicine

## 2022-07-05 ENCOUNTER — Encounter (INDEPENDENT_AMBULATORY_CARE_PROVIDER_SITE_OTHER): Payer: Self-pay | Admitting: Family Medicine

## 2022-07-05 VITALS — BP 116/78 | HR 85 | Temp 99.1°F | Ht 64.0 in | Wt 173.0 lb

## 2022-07-05 DIAGNOSIS — E8881 Metabolic syndrome: Secondary | ICD-10-CM | POA: Diagnosis not present

## 2022-07-05 DIAGNOSIS — Z6830 Body mass index (BMI) 30.0-30.9, adult: Secondary | ICD-10-CM | POA: Diagnosis not present

## 2022-07-05 DIAGNOSIS — G47411 Narcolepsy with cataplexy: Secondary | ICD-10-CM | POA: Diagnosis not present

## 2022-07-05 DIAGNOSIS — E669 Obesity, unspecified: Secondary | ICD-10-CM | POA: Diagnosis not present

## 2022-07-05 MED ORDER — ATORVASTATIN CALCIUM 20 MG PO TABS
20.0000 mg | ORAL_TABLET | Freq: Every day | ORAL | 1 refills | Status: DC
Start: 2022-07-05 — End: 2023-03-28

## 2022-07-05 MED ORDER — AMLODIPINE BESYLATE 10 MG PO TABS: 10.0000 mg | ORAL_TABLET | Freq: Every day | ORAL | 1 refills | Status: DC

## 2022-07-05 MED ORDER — OMEPRAZOLE 40 MG PO CPDR: 40.0000 mg | DELAYED_RELEASE_CAPSULE | Freq: Every day | ORAL | 1 refills | Status: DC

## 2022-07-06 ENCOUNTER — Other Ambulatory Visit: Payer: Self-pay | Admitting: Nurse Practitioner

## 2022-07-06 ENCOUNTER — Telehealth: Payer: Self-pay | Admitting: Obstetrics and Gynecology

## 2022-07-06 DIAGNOSIS — K219 Gastro-esophageal reflux disease without esophagitis: Secondary | ICD-10-CM

## 2022-07-06 DIAGNOSIS — G47411 Narcolepsy with cataplexy: Secondary | ICD-10-CM | POA: Insufficient documentation

## 2022-07-06 DIAGNOSIS — N941 Unspecified dyspareunia: Secondary | ICD-10-CM

## 2022-07-06 MED ORDER — LIDOCAINE 5 % EX OINT: TOPICAL_OINTMENT | CUTANEOUS | 1 refills | Status: DC

## 2022-07-06 NOTE — Progress Notes (Addendum)
Patient came in for a self swab for vaginal discomfort.

## 2022-07-06 NOTE — Telephone Encounter (Signed)
Rx lidocaine oint ext prn dyspareunia

## 2022-07-08 ENCOUNTER — Telehealth: Payer: Self-pay

## 2022-07-08 NOTE — Telephone Encounter (Signed)
PA for lidocaine 5% ointment has been created and submitted.

## 2022-07-08 NOTE — Telephone Encounter (Signed)
PA approved, pt aware.

## 2022-07-09 ENCOUNTER — Encounter: Payer: Self-pay | Admitting: Nurse Practitioner

## 2022-07-09 DIAGNOSIS — F4310 Post-traumatic stress disorder, unspecified: Secondary | ICD-10-CM

## 2022-07-09 DIAGNOSIS — F411 Generalized anxiety disorder: Secondary | ICD-10-CM

## 2022-07-09 DIAGNOSIS — F3132 Bipolar disorder, current episode depressed, moderate: Secondary | ICD-10-CM

## 2022-07-12 NOTE — Progress Notes (Unsigned)
Chief Complaint:   OBESITY Rachael Mosley is here to discuss her progress with her obesity treatment plan along with follow-up of her obesity related diagnoses. Rachael Mosley is on the Category 3 Plan+80-100 protein daily and states she is following her eating plan approximately 100% of the time. Rachael Mosley states she is using an ab roller and biking 15 minutes 4 times per week.  Today's visit was #: 4 Starting weight: 188 lbs Starting date: 04/20/2022 Today's weight: 173 lbs Today's date: 07/05/2022 Total lbs lost to date: 15 lbs Total lbs lost since last in-office visit: 8 lbs  Interim History: She did well on meal plan.  Had to go through a work up for abnormal mammogram.  She had control over stress eating and reports good support.  Denies hunger or cravings.    Subjective:   1. Metabolic syndrome She is doing well on metformin 1,000 mg BID.  She denies any side effects.   2. Transient total loss of muscle tone Reviewed Bioimpedance.  Tracking protein intake about 80-90 grams daily, but getting little exercise in.  Assessment/Plan:   1. Metabolic syndrome She is to continue metformin 1,000 mg BID.   2. Transient total loss of muscle tone Reviewed free weights, bands, body weight training exercise to do at home.   3. Obesity,current BMI 30.8 Given exercise examples for adding in weight training.   Datra is currently in the action stage of change. As such, her goal is to continue with weight loss efforts. She has agreed to the Category 3 Plan +75-85 protein daily.   Exercise goals:  20 minutes 5 times per week.   Behavioral modification strategies: increasing lean protein intake, decreasing simple carbohydrates, increasing vegetables, increasing water intake, decreasing eating out, no skipping meals, meal planning and cooking strategies, better snacking choices, and decreasing junk food.  Rachael Mosley has agreed to follow-up with our clinic in 3 weeks. She was informed of the  importance of frequent follow-up visits to maximize her success with intensive lifestyle modifications for her multiple health conditions.   Objective:   Blood pressure 116/78, pulse 85, temperature 99.1 F (37.3 C), height '5\' 4"'$  (1.626 m), weight 173 lb (78.5 kg), last menstrual period 05/21/2020, SpO2 98 %. Body mass index is 29.7 kg/m.  General: Cooperative, alert, well developed, in no acute distress. HEENT: Conjunctivae and lids unremarkable. Cardiovascular: Regular rhythm.  Lungs: Normal work of breathing. Neurologic: No focal deficits.   Lab Results  Component Value Date   CREATININE 0.51 03/11/2022   BUN 11 03/11/2022   NA 140 03/11/2022   K 4.3 03/11/2022   CL 105 03/11/2022   CO2 26 03/11/2022   Lab Results  Component Value Date   ALT 12 03/11/2022   AST 14 03/11/2022   ALKPHOS 91 03/11/2022   BILITOT 0.2 03/11/2022   Lab Results  Component Value Date   HGBA1C 6.2 03/11/2022   No results found for: "INSULIN" Lab Results  Component Value Date   TSH 1.22 03/11/2022   Lab Results  Component Value Date   CHOL 151 03/11/2022   HDL 41.10 03/11/2022   LDLDIRECT 73.0 03/11/2022   TRIG 313.0 (H) 03/11/2022   CHOLHDL 4 03/11/2022   Lab Results  Component Value Date   VD25OH 33.2 04/20/2022   Lab Results  Component Value Date   WBC 15.0 (H) 03/11/2022   HGB 14.6 03/11/2022   HCT 45.0 03/11/2022   MCV 89.3 03/11/2022   PLT 269.0 03/11/2022   No results found  for: "IRON", "TIBC", "FERRITIN"  Attestation Statements:   Reviewed by clinician on day of visit: allergies, medications, problem list, medical history, surgical history, family history, social history, and previous encounter notes.  Time spent on visit including pre-visit chart review and post-visit care and charting was 30 minutes.   I, Davy Pique, am acting as Location manager for Loyal Gambler, DO.  I have reviewed the above documentation for accuracy and completeness, and I agree with the  above. Dell Ponto, DO

## 2022-07-28 ENCOUNTER — Encounter: Payer: Medicare Other | Attending: Physician Assistant | Admitting: Internal Medicine

## 2022-07-28 DIAGNOSIS — T63301A Toxic effect of unspecified spider venom, accidental (unintentional), initial encounter: Secondary | ICD-10-CM | POA: Insufficient documentation

## 2022-07-28 DIAGNOSIS — W57XXXA Bitten or stung by nonvenomous insect and other nonvenomous arthropods, initial encounter: Secondary | ICD-10-CM | POA: Diagnosis not present

## 2022-07-28 DIAGNOSIS — F1029 Alcohol dependence with unspecified alcohol-induced disorder: Secondary | ICD-10-CM | POA: Diagnosis not present

## 2022-07-28 DIAGNOSIS — F3132 Bipolar disorder, current episode depressed, moderate: Secondary | ICD-10-CM

## 2022-07-28 DIAGNOSIS — I10 Essential (primary) hypertension: Secondary | ICD-10-CM | POA: Diagnosis not present

## 2022-08-02 ENCOUNTER — Ambulatory Visit (INDEPENDENT_AMBULATORY_CARE_PROVIDER_SITE_OTHER): Payer: Medicare Other | Admitting: Family Medicine

## 2022-08-02 NOTE — Progress Notes (Signed)
[place], [place] ([id number]) [id number] Page 1 of 5 Visit Report for 07/28/2022 Abuse Risk Screen Details Patient Name: Date of Service: Rachael Mosley [facility] 07/28/2022 8:45 A M Medical Record Number: [id number] Patient Account Number: [id number] Date of Birth/Sex: Treating RN: 11-Jul-1966 (56 y.o. Rachael Mosley Primary Care Lathon Adan: Karl Ito Other Clinician: Referring Jesyca Weisenburger: Treating Jerryl Holzhauer/Extender: Ree Kida in Treatment: 0 Abuse Risk Screen Items Answer ABUSE RISK SCREEN: Has anyone close to you tried to hurt or harm you recentlyo No Do you feel uncomfortable with anyone in your familyo No Has anyone forced you do things that you didnt want to doo No Electronic Signature(s) Signed: 08/02/2022 10:26:37 AM By: Carlene Coria RN Entered By: Carlene Coria on 07/28/2022 09:03:16 -------------------------------------------------------------------------------- Activities of Daily Living Details Patient Name: Date of Service: Rachael Mosley [facility] 07/28/2022 8:45 A M Medical Record Number: [id number] Patient Account Number: [id number] Date of Birth/Sex: Treating RN: Jul 03, 1966 (56 y.o. Rachael Mosley Primary Care Amarri Michaelson: Karl Ito Other Clinician: Referring Ronnesha Mester: Treating Blen Ransome/Extender: Ree Kida in Treatment: 0 Activities of Daily Living Items Answer Activities of Daily Living (Please select one for each item) Drive Automobile Completely Able T Medications ake Completely Able Use T elephone Completely Able Care for Appearance Completely Able Use T oilet Completely Able Bath / Shower Completely Able Dress Self Completely Able Feed Self Completely Able Walk Completely Able Get In / Out Bed Completely Able Housework Completely [place], [place] ([id number]) [id number] Page 2 of 5 Prepare Meals Completely Able Handle Money Completely  Able Shop for Self Completely Able Electronic Signature(s) Signed: 08/02/2022 10:26:37 AM By: Carlene Coria RN Entered By: Carlene Coria on 07/28/2022 09:03:37 -------------------------------------------------------------------------------- Education Screening Details Patient Name: Date of Service: Rachael Mosley, Rachael Mosley [facility] 07/28/2022 8:45 A M Medical Record Number: [id number] Patient Account Number: [id number] Date of Birth/Sex: Treating RN: 01-14-1966 (56 y.o. Rachael Mosley Primary Care Rondalyn Belford: Karl Ito Other Clinician: Referring Rachael Mosley: Treating Starleen Trussell/Extender: Ree Kida in Treatment: 0 Primary Learner Assessed: Patient Learning Preferences/Education Level/Primary Language Learning Preference: Explanation Highest Education Level: College or Above Preferred Language: English Cognitive Barrier Language Barrier: No Translator Needed: No Memory Deficit: No Emotional Barrier: No Physical Barrier Impaired Vision: Yes Glasses Impaired Hearing: No Decreased Hand dexterity: No Knowledge/Comprehension Knowledge Level: Medium Comprehension Level: High Ability to understand written instructions: High Ability to understand verbal instructions: High Motivation Anxiety Level: Anxious Cooperation: Cooperative Education Importance: Acknowledges Need Interest in Health Problems: Asks Questions Perception: Coherent Willingness to Engage in Self-Management High Activities: Readiness to Engage in Self-Management High Activities: Electronic Signature(s) Signed: 08/02/2022 10:26:37 AM By: Carlene Coria RN Entered By: Carlene Coria on 07/28/2022 09:04:03 Mosley, Rachael ([id number]) [id number] Page 3 of 5 -------------------------------------------------------------------------------- Fall Risk Assessment Details Patient Name: Date of Service: Rachael Mosley [facility] 07/28/2022 8:45 A M Medical Record Number: [id number] Patient  Account Number: [id number] Date of Birth/Sex: Treating RN: 04-27-1966 (56 y.o. Rachael Mosley Primary Care Rachael Mosley: Karl Ito Other Clinician: Referring Carilyn Woolston: Treating Conlee Sliter/Extender: Ree Kida in Treatment: 0 Fall Risk Assessment Items Have you had 2 or more falls in the last 12 monthso 0 No Have you had any fall that resulted in injury in the last 12 monthso 0 No FALLS RISK SCREEN History of falling - immediate or within 3 months 0 No Secondary diagnosis (Do you have 2 or more medical diagnoseso) 0 No Ambulatory aid None/bed rest/wheelchair/nurse 0 No Crutches/cane/walker 0 No Furniture 0 No  Intravenous therapy Access/Saline/Heparin Lock 0 No Gait/Transferring Normal/ bed rest/ wheelchair 0 No Weak (short steps with or without shuffle, stooped but able to lift head while walking, may seek 0 No support from furniture) Impaired (short steps with shuffle, may have difficulty arising from chair, head down, impaired 0 No balance) Mental Status Oriented to own ability 0 No Electronic Signature(s) Signed: 08/02/2022 10:26:37 AM By: Carlene Coria RN Entered By: Carlene Coria on 07/28/2022 09:04:08 -------------------------------------------------------------------------------- Foot Assessment Details Patient Name: Date of Service: Rachael Mosley, Rachael Mosley [facility] 07/28/2022 8:45 A M Medical Record Number: [id number] Patient Account Number: [id number] Date of Birth/Sex: Treating RN: 03/03/1966 (56 y.o. Rachael Mosley Primary Care Ahava Mosley: Karl Ito Other Clinician: Referring Camary Sosa: Treating Keveon Amsler/Extender: Ree Kida in Treatment: 0 Foot Assessment Items Site Locations [place], [place] ([id number]) [phone] [id number] Page 4 of 5 + = Sensation present, - = Sensation absent, C = Callus, U = Ulcer R = Redness, W = Warmth, M = Maceration, PU = Pre-ulcerative lesion F = Fissure, S = Swelling, D =  Dryness Assessment Right: Left: Other Deformity: No No Prior Foot Ulcer: No No Prior Amputation: No No Charcot Joint: No No Ambulatory Status: Ambulatory Without Help Gait: Steady Electronic Signature(s) Signed: 08/02/2022 10:26:37 AM By: Carlene Coria RN Entered By: Carlene Coria on 07/28/2022 09:05:40 -------------------------------------------------------------------------------- Nutrition Risk Screening Details Patient Name: Date of Service: Rachael Mosley [facility] 07/28/2022 8:45 A M Medical Record Number: [id number] Patient Account Number: [id number] Date of Birth/Sex: Treating RN: July 07, 1966 (56 y.o. Rachael Mosley Primary Care Nicholes Hibler: Karl Ito Other Clinician: Referring Codie Hainer: Treating Rainna Nearhood/Extender: Ree Kida in Treatment: 0 Height (in): 64 Weight (lbs): 167 Body Mass Index (BMI): 28.7 Nutrition Risk Screening Items Score Screening NUTRITION RISK SCREEN: I have an illness or condition that made me change the kind and/or amount of food I eat 0 No I eat fewer than two meals per day 0 No I eat few fruits and vegetables, or milk products 0 No I have three or more drinks of beer, liquor or wine almost every day 0 No I have tooth or mouth problems that make it hard for me to eat 0 No I don't always have enough money to buy the food I need 0 No [place], [place] ([id number]) [phone] [id number] Page 5 of 5 I eat alone most of the time 0 No I take three or more different prescribed or over-the-counter drugs a day 0 No Without wanting to, I have lost or gained 10 pounds in the last six months 0 No I am not always physically able to shop, cook and/or feed myself 0 No Nutrition Protocols Good Risk Protocol 0 No interventions needed Moderate Risk Protocol High Risk Proctocol Risk Level: Good Risk Score: 0 Electronic Signature(s) Signed: 08/02/2022 10:26:37 AM By: Carlene Coria RN Entered By: Carlene Coria on 07/28/2022  09:04:23

## 2022-08-02 NOTE — Progress Notes (Signed)
[place], [place] ([id number]) [id number] Page 1 of 7 Visit Report for 07/28/2022 Allergy List Details Patient Name: Date of Service: Rachael [facility] 07/28/2022 8:45 A M Medical Record Number: [id number] Patient Account Number: [id number] Date of Birth/Sex: Treating RN: 02-09-1966 (56 y.o. Orvan Falconer Primary Care Mikyle Sox: Karl Ito Other Clinician: Referring Herman Mell: Treating Derrisha Foos/Extender: Ree Kida in Treatment: 0 Allergies Active Allergies No Known Allergies Allergy Notes Electronic Signature(s) Signed: 08/02/2022 10:26:37 AM By: Carlene Coria RN Entered By: Carlene Coria on 07/28/2022 09:01:12 -------------------------------------------------------------------------------- Arrival Information Details Patient Name: Date of Service: Rachael Mosley, Rachael Mosley 07/28/2022 8:45 A M Medical Record Number: [id number] Patient Account Number: [id number] Date of Birth/Sex: Treating RN: 1965-11-24 (56 y.o. Orvan Falconer Primary Care Girtrude Enslin: Karl Ito Other Clinician: Referring Raylyn Speckman: Treating Alvilda Mckenna/Extender: Ree Kida in Treatment: 0 Visit Information Patient Arrived: Ambulatory Arrival Time: 08:56 Accompanied By: self Transfer Assistance: None Patient Identification Verified: Yes Secondary Verification Process Completed: Yes Patient Requires Transmission-Based Precautions: No Patient Has Alerts: No Electronic Signature(s) Signed: 08/02/2022 10:26:37 AM By: Carlene Coria RN Entered By: Carlene Coria on 07/28/2022 09:00:17 [place], Rachael Mosley ([id number]) [id number] Page 2 of 7 -------------------------------------------------------------------------------- Clinic Level of Care Assessment Details Patient Name: Date of Service: Rachael Mosley [facility] 07/28/2022 8:45 A M Medical Record Number: [id number] Patient Account Number: [id number] Date of Birth/Sex: Treating  RN: 04-18-1966 (56 y.o. Orvan Falconer Primary Care Tyreque Finken: Karl Ito Other Clinician: Referring Zachari Alberta: Treating Desarae Placide/Extender: Ree Kida in Treatment: 0 Clinic Level of Care Assessment Items TOOL 2 Quantity Score X- 1 0 Use when only an EandM is performed on the INITIAL visit ASSESSMENTS - Nursing Assessment / Reassessment X- 1 20 General Physical Exam (combine w/ comprehensive assessment (listed just below) when performed on new pt. evals) X- 1 25 Comprehensive Assessment (HX, ROS, Risk Assessments, Wounds Hx, etc.) ASSESSMENTS - Wound and Skin A ssessment / Reassessment '[]'$  - 0 Simple Wound Assessment / Reassessment - one wound '[]'$  - 0 Complex Wound Assessment / Reassessment - multiple wounds '[]'$  - 0 Dermatologic / Skin Assessment (not related to wound area) ASSESSMENTS - Ostomy and/or Continence Assessment and Care '[]'$  - 0 Incontinence Assessment and Management '[]'$  - 0 Ostomy Care Assessment and Management (repouching, etc.) PROCESS - Coordination of Care X - Simple Patient / Family Education for ongoing care 1 15 '[]'$  - 0 Complex (extensive) Patient / Family Education for ongoing care '[]'$  - 0 Staff obtains Programmer, systems, Records, T Results / Process Orders est '[]'$  - 0 Staff telephones HHA, Nursing Homes / Clarify orders / etc '[]'$  - 0 Routine Transfer to another Facility (non-emergent condition) '[]'$  - 0 Routine Hospital Admission (non-emergent condition) X- 1 15 New Admissions / Biomedical engineer / Ordering NPWT Apligraf, etc. , '[]'$  - 0 Emergency Hospital Admission (emergent condition) X- 1 10 Simple Discharge Coordination '[]'$  - 0 Complex (extensive) Discharge Coordination PROCESS - Special Needs '[]'$  - 0 Pediatric / Minor Patient Management '[]'$  - 0 Isolation Patient Management '[]'$  - 0 Hearing / Language / Visual special needs '[]'$  - 0 Assessment of Community assistance (transportation, D/C planning, etc.) '[]'$  - 0 Additional  assistance / Altered mentation '[]'$  - 0 Support Surface(s) Assessment (bed, cushion, seat, etc.) INTERVENTIONS - Wound Cleansing / Measurement '[]'$  - 0 Wound Imaging (photographs - any number of wounds) Mullenbach, Kristel ([id number]) [phone].pdf Page 3 of 7 '[]'$  - 0 Wound Tracing (instead of photographs) '[]'$  - 0 Simple Wound Measurement - one wound '[]'$  -  0 Complex Wound Measurement - multiple wounds '[]'$  - 0 Simple Wound Cleansing - one wound '[]'$  - 0 Complex Wound Cleansing - multiple wounds INTERVENTIONS - Wound Dressings '[]'$  - 0 Small Wound Dressing one or multiple wounds '[]'$  - 0 Medium Wound Dressing one or multiple wounds '[]'$  - 0 Large Wound Dressing one or multiple wounds '[]'$  - 0 Application of Medications - injection INTERVENTIONS - Miscellaneous '[]'$  - 0 External ear exam '[]'$  - 0 Specimen Collection (cultures, biopsies, blood, body fluids, etc.) '[]'$  - 0 Specimen(s) / Culture(s) sent or taken to Lab for analysis '[]'$  - 0 Patient Transfer (multiple staff / Rachael Mosley / Similar devices) '[]'$  - 0 Simple Staple / Suture removal (25 or less) '[]'$  - 0 Complex Staple / Suture removal (26 or more) '[]'$  - 0 Hypo / Hyperglycemic Management (close monitor of Blood Glucose) X- 1 15 Ankle / Brachial Index (ABI) - do not check if billed separately Has the patient been seen at the hospital within the last three years: Yes Total Score: 100 Level Of Care: New/Established - Level 3 Electronic Signature(s) Signed: 08/02/2022 10:26:37 AM By: Carlene Coria RN Entered By: Carlene Coria on 07/28/2022 09:26:33 -------------------------------------------------------------------------------- Encounter Discharge Information Details Patient Name: Date of Service: Rachael Mosley, Rachael Mosley [facility] 07/28/2022 8:45 A M Medical Record Number: [id number] Patient Account Number: [id number] Date of Birth/Sex: Treating RN: 02-24-1966 (56 y.o. Orvan Falconer Primary Care Vonda Harth: Karl Ito Other  Clinician: Referring Corneilus Heggie: Treating Perley Arthurs/Extender: Ree Kida in Treatment: 0 Encounter Discharge Information Items Discharge Condition: Stable Ambulatory Status: Ambulatory Discharge Destination: Home Transportation: Private Auto Accompanied By: self Schedule Follow-up Appointment: Yes Clinical Summary of Care: Electronic Signature(s) Signed: 08/02/2022 10:26:37 AM By: Carlene Coria RN Rachael Mosley, Signed: 08/02/2022 10:26:37 AM By: Carlene Coria RN Rachael Mosley ([id number]) [phone].pdf Page 4 of 7 Entered By: Carlene Coria on 07/28/2022 09:27:16 -------------------------------------------------------------------------------- Lower Extremity Assessment Details Patient Name: Date of Service: Rachael Mosley [facility] 07/28/2022 8:45 A M Medical Record Number: [id number] Patient Account Number: [id number] Date of Birth/Sex: Treating RN: Jan 27, 1966 (56 y.o. Orvan Falconer Primary Care Brylea Pita: Karl Ito Other Clinician: Referring Nell Schrack: Treating Alecia Doi/Extender: Ree Kida in Treatment: 0 Edema Assessment Assessed: [Left: No] [Right: No] Edema: [Left: N] [Right: o] Calf Left: Right: Point of Measurement: 33 cm From Medial Instep 36 cm Ankle Left: Right: Point of Measurement: 11 cm From Medial Instep 21 cm Knee To Floor Left: Right: From Medial Instep 43 cm Vascular Assessment Pulses: Dorsalis Pedis Palpable: [Left:Yes] Doppler Audible: [Left:Yes] Blood Pressure: Brachial: [Left:135] Ankle: [Left:Dorsalis Pedis: 110 0.81] Electronic Signature(s) Signed: 08/02/2022 10:26:37 AM By: Carlene Coria RN Entered By: Carlene Coria on 07/28/2022 09:10:36 Multi Wound Chart Details -------------------------------------------------------------------------------- Rachael Mosley ([id number]) [id number] Page 5 of 7 Patient Name: Date of Service: Rachael Mosley [facility] 07/28/2022 8:45 A  M Medical Record Number: [id number] Patient Account Number: [id number] Date of Birth/Sex: Treating RN: 1966-06-25 (56 y.o. Orvan Falconer Primary Care Levis Nazir: Karl Ito Other Clinician: Referring Rachael Mosley: Treating Shir Bergman/Extender: Ree Kida in Treatment: 0 Vital Signs Height(in): 64 Pulse(bpm): 76 Weight(lbs): 167 Blood Pressure(mmHg): 135/85 Body Mass Index(BMI): 28.7 Temperature(F): 98.3 Respiratory Rate(breaths/min): 18 Wound Assessments Treatment Notes Electronic Signature(s) Signed: 08/02/2022 10:26:37 AM By: Carlene Coria RN Entered By: Carlene Coria on 07/28/2022 09:24:19 -------------------------------------------------------------------------------- Multi-Disciplinary Care Plan Details Patient Name: Date of Service: Rachael Mosley, Rachael Mosley [facility] 07/28/2022 8:45 A M Medical Record Number: [id number] Patient Account Number: [id number] Date of Birth/Sex: Treating RN: Jul 21, 1966 (56 y.o.  Orvan Falconer Primary Care Petr Bontempo: Karl Ito Other Clinician: Referring Neli Fofana: Treating Rachael Mosley/Extender: Ree Kida in Treatment: 0 Active Inactive Electronic Signature(s) Signed: 08/02/2022 10:26:37 AM By: Carlene Coria RN Entered By: Carlene Coria on 07/28/2022 09:24:07 -------------------------------------------------------------------------------- Pain Assessment Details Patient Name: Date of Service: Rachael Mosley [facility] 07/28/2022 8:45 A M Medical Record Number: [id number] Patient Account Number: [id number] Date of Birth/Sex: Treating RN: 09/28/1965 (56 y.o. Orvan Falconer Primary Care Gloriana Piltz: Karl Ito Other Clinician: Referring Rachael Mosley: Treating Rachael Mosley/Extender: Clearnce Hasten [place], [place] ([id number]) [phone].pdf Page 6 of 7 Weeks in Treatment: 0 Active Problems Location of Pain Severity and Description of Pain Patient Has Paino No Site Locations Pain Management  and Medication Current Pain Management: Electronic Signature(s) Signed: 08/02/2022 10:26:37 AM By: Carlene Coria RN Entered By: Carlene Coria on 07/28/2022 09:00:23 -------------------------------------------------------------------------------- Patient/Caregiver Education Details Patient Name: Date of Service: Rachael Mosley, Rachael Mosley 11/8/2023andnbsp8:45 A M Medical Record Number: [id number] Patient Account Number: [id number] Date of Birth/Gender: Treating RN: 06-Jun-1966 (56 y.o. Orvan Falconer Primary Care Physician: Karl Ito Other Clinician: Referring Physician: Treating Physician/Extender: Ree Kida in Treatment: 0 Education Assessment Education Provided To: Patient Education Topics Provided Wound/Skin Impairment: Methods: Explain/Verbal Responses: State content correctly Electronic Signature(s) Signed: 08/02/2022 10:26:37 AM By: Carlene Coria RN Entered By: Carlene Coria on 07/28/2022 09:26:47 Rachael Mosley, Rachael Mosley ([id number]) [id number] Page 7 of 7 -------------------------------------------------------------------------------- Vitals Details Patient Name: Date of Service: Rachael Mosley [facility] 07/28/2022 8:45 A M Medical Record Number: [id number] Patient Account Number: [id number] Date of Birth/Sex: Treating RN: 1966/08/29 (56 y.o. Orvan Falconer Primary Care Annalese Stiner: Karl Ito Other Clinician: Referring Tiffannie Sloss: Treating Rachael Mosley/Extender: Ree Kida in Treatment: 0 Vital Signs Time Taken: 09:00 Temperature (F): 98.3 Height (in): 64 Pulse (bpm): 76 Source: Stated Respiratory Rate (breaths/min): 18 Weight (lbs): 167 Blood Pressure (mmHg): 135/85 Source: Stated Reference Range: 80 - 120 mg / dl Body Mass Index (BMI): 28.7 Electronic Signature(s) Signed: 08/02/2022 10:26:37 AM By: Carlene Coria RN Entered By: Carlene Coria on 07/28/2022 09:00:53

## 2022-08-02 NOTE — Progress Notes (Signed)
[place], [place] ([id number]) [id number] Page 1 of 6 Visit Report for 07/28/2022 Chief Complaint Document Details Patient Name: Date of Service: Rachael Mosley [facility] 07/28/2022 8:45 A M Medical Record Number: [id number] Patient Account Number: [id number] Date of Birth/Sex: Treating RN: 10/31/1965 (56 y.o. Orvan Falconer Primary Care Provider: Karl Ito Other Clinician: Referring Provider: Treating Provider/Extender: Ree Kida in Treatment: 0 Information Obtained from: Patient Chief Complaint 07/28/2022; concern for spider bite that occurred 1.5 years ago between the fourth and fifth left toes Electronic Signature(s) Signed: 07/28/2022 2:02:44 PM By: Kalman Shan DO Entered By: Kalman Shan on 07/28/2022 09:27:37 -------------------------------------------------------------------------------- HPI Details Patient Name: Date of Service: Rachael Mosley, [facility] 07/28/2022 8:45 A M Medical Record Number: [id number] Patient Account Number: [id number] Date of Birth/Sex: Treating RN: 1966/01/31 (56 y.o. Orvan Falconer Primary Care Provider: Karl Ito Other Clinician: Referring Provider: Treating Provider/Extender: Ree Kida in Treatment: 0 History of Present Illness HPI Description: 07/28/2022 Ms. Rachael Mosley is a 56 year old female with a past medical history of alcohol use disorder, bipolar disorder, tobacco use disorder and hypertension that presents to the clinic for a previous spider bite site. She states that this occurred a year and a half ago and the area has slowly been healing. She reports seeing the spider bite her but is unclear what it looks like. She has not been placing anything on The spider bite site. She she denies drainage from the site. She denies signs of infection. Electronic Signature(s) Signed: 07/28/2022 2:02:44 PM By: Kalman Shan DO Entered By: Kalman Shan on 07/28/2022  09:30:41 [place], [place] ([id number]) [id number] Page 2 of 6 -------------------------------------------------------------------------------- Physical Exam Details Patient Name: Date of Service: Rachael Mosley [facility] 07/28/2022 8:45 A M Medical Record Number: [id number] Patient Account Number: [id number] Date of Birth/Sex: Treating RN: 08/31/1966 (56 y.o. Orvan Falconer Primary Care Provider: Karl Ito Other Clinician: Referring Provider: Treating Provider/Extender: Ree Kida in Treatment: 0 Constitutional . Cardiovascular . Psychiatric . Notes Between the fourth and fifth digits on the left foot there is epithelization with a small divot to a previous wound site. Electronic Signature(s) Signed: 07/28/2022 2:02:44 PM By: Kalman Shan DO Entered By: Kalman Shan on 07/28/2022 09:32:14 -------------------------------------------------------------------------------- Physician Orders Details Patient Name: Date of Service: Rachael Mosley, [place] 07/28/2022 8:45 A M Medical Record Number: [id number] Patient Account Number: [id number] Date of Birth/Sex: Treating RN: 20-Mar-1966 (56 y.o. Orvan Falconer Primary Care Provider: Karl Ito Other Clinician: Referring Provider: Treating Provider/Extender: Ree Kida in Treatment: 0 Verbal / Phone Orders: No Diagnosis Coding ICD-10 Coding Code Description W57.XXXA Bitten or stung by nonvenomous insect and other nonvenomous arthropods, initial encounter I10 Essential (primary) hypertension F10.29 Alcohol dependence with unspecified alcohol-induced disorder F31.32 Bipolar disorder, current episode depressed, moderate Discharge From [place] Only - if any drainage use triple antibiotic ointment and call clinic [place], [place] ([id number]) [id number] Page 3 of 6 Electronic Signature(s) Signed: 07/28/2022 2:02:44 PM By:  Kalman Shan DO Entered By: Kalman Shan on 07/28/2022 09:34:19 -------------------------------------------------------------------------------- Problem List Details Patient Name: Date of Service: Rachael Mosley, [name] 07/28/2022 8:45 A M Medical Record Number: [id number] Patient Account Number: [id number] Date of Birth/Sex: Treating RN: Feb 01, 1966 (56 y.o. Orvan Falconer Primary Care Provider: Karl Ito Other Clinician: Referring Provider: Treating Provider/Extender: Ree Kida in Treatment: 0 Active Problems ICD-10 Encounter Code Description Active Date MDM Diagnosis W57.XXXA Bitten or stung by nonvenomous insect and other nonvenomous arthropods,  07/28/2022 No Yes initial encounter [place] (primary) hypertension 07/28/2022 No Yes F10.29 Alcohol dependence with unspecified alcohol-induced disorder 07/28/2022 No Yes F31.32 Bipolar disorder, current episode depressed, moderate 07/28/2022 No Yes Inactive Problems Resolved Problems Electronic Signature(s) Signed: 07/28/2022 2:02:44 PM By: Kalman Shan DO Entered By: Kalman Shan on 07/28/2022 09:20:39 -------------------------------------------------------------------------------- Progress Note Details Patient Name: Date of Service: Rachael Mosley, [place] 07/28/2022 8:45 [address] [place], [place] ([id number]) [id number] Page 4 of 6 Medical Record Number: [id number] Patient Account Number: [id number] Date of Birth/Sex: Treating RN: Jan 31, 1966 (56 y.o. Orvan Falconer Primary Care Provider: Karl Ito Other Clinician: Referring Provider: Treating Provider/Extender: Ree Kida in Treatment: 0 Subjective Chief Complaint Information obtained from Patient 07/28/2022; concern for spider bite that occurred 1.5 years ago between the fourth and fifth left toes History of Present Illness (HPI) 07/28/2022 Ms. Rachael Mosley is a 56 year old female with a  past medical history of alcohol use disorder, bipolar disorder, tobacco use disorder and hypertension that presents to the clinic for a previous spider bite site. She states that this occurred a year and a half ago and the area has slowly been healing. She reports seeing the spider bite her but is unclear what it looks like. She has not been placing anything on The spider bite site. She she denies drainage from the site. She denies signs of infection. Patient History Allergies No Known Allergies Social History Former smoker, Marital Status - Married, Alcohol Use - Rarely, Drug Use - Current History - weed, Caffeine Use - Daily. Medical History Cardiovascular Patient has history of Hypertension Medical A Surgical History Notes nd Psychiatric bi polar , PTSD Review of Systems (ROS) Eyes Complains or has symptoms of Glasses / Contacts. Psychiatric Complains or has symptoms of Anxiety. Objective Constitutional Vitals Time Taken: 9:00 AM, Height: 64 in, Source: Stated, Weight: 167 lbs, Source: Stated, BMI: 28.7, Temperature: 98.3 F, Pulse: 76 bpm, Respiratory Rate: 18 breaths/min, Blood Pressure: 135/85 mmHg. General Notes: Between the fourth and fifth digits on the left foot there is epithelization with a small divot to a previous wound site. Assessment Active Problems ICD-10 Bitten or stung by nonvenomous insect and other nonvenomous arthropods, initial encounter Essential (primary) hypertension Alcohol dependence with unspecified alcohol-induced disorder Bipolar disorder, current episode depressed, moderate Patient presents for concern of a previous spider bite to her left foot between the fourth and fifth digits that occurred a year and a half ago. There appears to have been a wound there prior however this is healed with a divot. I recommended that if she were to develop drainage to start over-the-counter antibiotic ointment and call the office for an appointment.She knows to  call with any questions or concerns. Plan [place], [place] ([id number]) [id number] Page 5 of 6 Discharge From [facility] Services: Consult Only - if any drainage use triple antibiotic ointment and call clinic 1. Follow-up as needed Electronic Signature(s) Signed: 07/28/2022 2:02:44 PM By: Kalman Shan DO Entered By: Kalman Shan on 07/28/2022 09:33:22 -------------------------------------------------------------------------------- ROS/PFSH Details Patient Name: Date of Service: Rachael Mosley, [facility] 07/28/2022 8:45 A M Medical Record Number: [id number] Patient Account Number: [id number] Date of Birth/Sex: Treating RN: [birth year] (56 y.o. Orvan Falconer Primary Care Provider: Karl Ito Other Clinician: Referring Provider: Treating Provider/Extender: Ree Kida in Treatment: 0 Eyes Complaints and Symptoms: Positive for: Glasses / Contacts Psychiatric Complaints and Symptoms: Positive for: Anxiety Medical History: Past Medical History Notes: bi polar , PTSD Cardiovascular Medical History: Positive for: Hypertension Immunizations Pneumococcal  Vaccine: Received Pneumococcal Vaccination: Yes Received Pneumococcal Vaccination On or After 60th Birthday: No Implantable Devices None Family and Social History Former smoker; Marital Status - Married; Alcohol Use: Rarely; Drug Use: Current History - weed; Caffeine Use: Daily Electronic Signature(s) Signed: 07/28/2022 2:02:44 PM By: Kalman Shan DO Signed: 08/02/2022 10:26:37 AM By: Carlene Coria RN Entered By: Carlene Coria on 07/28/2022 [place], [place] ([id number]) [id number] Page 6 of 6 -------------------------------------------------------------------------------- SuperBill Details Patient Name: Date of Service: Rachael Mosley [facility] 07/28/2022 Medical Record Number: [id number] Patient Account Number: [id number] Date of Birth/Sex: Treating  RN: February 29, 1966 (56 y.o. Orvan Falconer Primary Care Provider: Karl Ito Other Clinician: Referring Provider: Treating Provider/Extender: Ree Kida in Treatment: 0 Diagnosis Coding ICD-10 Codes Code Description [phone] Bitten or stung by nonvenomous insect and other nonvenomous arthropods, initial encounter I10 Essential (primary) hypertension F10.29 Alcohol dependence with unspecified alcohol-induced disorder F31.32 Bipolar disorder, current episode depressed, moderate Facility Procedures : CPT4 Code: [id number] Description: 99213 - WOUND CARE VISIT-LEV 3 EST PT Modifier: Quantity: 1 Physician Procedures : CPT4 Code Description Modifier [id number] WC PHYS LEVEL 3 NEW PT ICD-10 Diagnosis Description W57.XXXA Bitten or stung by nonvenomous insect and other nonvenomous arthropods, initial encounter I10 Essential (primary) hypertension F10.29 Alcohol dependence  with unspecified alcohol-induced disorder F31.32 Bipolar disorder, current episode depressed, moderate Quantity: 1 Electronic Signature(s) Signed: 07/28/2022 2:02:44 PM By: Kalman Shan DO Entered By: Kalman Shan on 07/28/2022 09:33:50

## 2022-08-10 ENCOUNTER — Ambulatory Visit (INDEPENDENT_AMBULATORY_CARE_PROVIDER_SITE_OTHER): Payer: Medicare Other | Admitting: Family Medicine

## 2022-08-10 ENCOUNTER — Encounter (INDEPENDENT_AMBULATORY_CARE_PROVIDER_SITE_OTHER): Payer: Self-pay | Admitting: Family Medicine

## 2022-08-10 VITALS — BP 109/75 | HR 79 | Temp 99.1°F | Ht 64.0 in | Wt 171.0 lb

## 2022-08-10 DIAGNOSIS — E559 Vitamin D deficiency, unspecified: Secondary | ICD-10-CM

## 2022-08-10 DIAGNOSIS — R7303 Prediabetes: Secondary | ICD-10-CM

## 2022-08-10 DIAGNOSIS — E8881 Metabolic syndrome: Secondary | ICD-10-CM | POA: Diagnosis not present

## 2022-08-10 DIAGNOSIS — Z6829 Body mass index (BMI) 29.0-29.9, adult: Secondary | ICD-10-CM

## 2022-08-10 DIAGNOSIS — E669 Obesity, unspecified: Secondary | ICD-10-CM

## 2022-08-10 MED ORDER — METFORMIN HCL 1000 MG PO TABS: 1000.0000 mg | ORAL_TABLET | Freq: Two times a day (BID) | ORAL | 0 refills | Status: DC

## 2022-08-15 LAB — HEMOGLOBIN A1C
Est. average glucose Bld gHb Est-mCnc: 123 mg/dL
Hgb A1c MFr Bld: 5.9 % — ABNORMAL HIGH (ref 4.8–5.6)

## 2022-08-15 LAB — VITAMIN D, 25-HYDROXY, TOTAL: Vitamin D, 25-Hydroxy, Serum: 65 ng/mL

## 2022-08-16 ENCOUNTER — Ambulatory Visit: Payer: Medicare Other | Admitting: Physician Assistant

## 2022-08-19 ENCOUNTER — Other Ambulatory Visit (INDEPENDENT_AMBULATORY_CARE_PROVIDER_SITE_OTHER): Payer: Self-pay | Admitting: Family Medicine

## 2022-08-19 DIAGNOSIS — E8881 Metabolic syndrome: Secondary | ICD-10-CM

## 2022-08-24 ENCOUNTER — Encounter: Payer: Self-pay | Admitting: Nurse Practitioner

## 2022-08-24 DIAGNOSIS — G4730 Sleep apnea, unspecified: Secondary | ICD-10-CM

## 2022-08-24 NOTE — Progress Notes (Signed)
Chief Complaint:   OBESITY Rachael Mosley is here to discuss her progress with her obesity treatment plan along with follow-up of her obesity related diagnoses. Rachael Mosley is on the Category 3 Plan and states she is following her eating plan approximately 80% of the time. Rachael Mosley states she is doing weights and planks 15 minutes 6 times per week.  Today's visit was #: 5 Starting weight: 188 lbs Starting date: 04/20/2022 Today's weight: 171 lbs Today's date: 08/10/2022 Total lbs lost to date: 17 lbs Total lbs lost since last in-office visit: 2 lbs  Interim History: Added weights at home 5-6 days per week.  Denies meal skipping.  Not feeling full with meals.  Getting veggies with meals.  Feeling hungry in the afternoon and evening.  Picking baby bell, greek yogurt, fiber one brownie, tuna in pouch, yasso bars.  Wanting more than one snack.  Gets some water in.   Subjective:   1. Metabolic syndrome Actively working on diet and exercise.  Seeing a reduction in visceral body fat.    2. Vitamin D deficiency Taking OTC Vitamin D 1,000 IU daily in addition to 1600 IU in a daily multivitamin.   3. Prediabetes Actively working on reducing sugar intake on metformin 1000 mg BID without adverse side effects.    Assessment/Plan:   1. Metabolic syndrome Continue lifestyle changes.   Refill - metFORMIN (GLUCOPHAGE) 1000 MG tablet; Take 1 tablet (1,000 mg total) by mouth 2 (two) times daily with a meal.  Dispense: 180 tablet; Refill: 0  2. Vitamin D deficiency Recheck Vitamin D level today (>50 is at goal) - Vitamin D, 25-Hydroxy, Total  3. Prediabetes Recheck A1c today.   - Hemoglobin A1c  4. Obesity, current BMI 29.5 Gave ideas for how to increase lean protein and fiber with meals.    Rachael Mosley is currently in the action stage of change. As such, her goal is to continue with weight loss efforts. She has agreed to the Category 3 Plan+80 protein daily.    Exercise goals:  Increase walking  time.   Behavioral modification strategies: increasing lean protein intake, increasing vegetables, increasing water intake, increasing high fiber foods, no skipping meals, keeping healthy foods in the home, and better snacking choices.  Rachael Mosley has agreed to follow-up with our clinic in 3 weeks. She was informed of the importance of frequent follow-up visits to maximize her success with intensive lifestyle modifications for her multiple health conditions.   Rachael Mosley was informed we would discuss her lab results at her next visit unless there is a critical issue that needs to be addressed sooner. Rachael Mosley agreed to keep her next visit at the agreed upon time to discuss these results.  Objective:   Blood pressure 109/75, pulse 79, temperature 99.1 F (37.3 C), height '5\' 4"'$  (1.626 m), weight 171 lb (77.6 kg), last menstrual period 05/21/2020, SpO2 99 %. Body mass index is 29.35 kg/m.  General: Cooperative, alert, well developed, in no acute distress. HEENT: Conjunctivae and lids unremarkable. Cardiovascular: Regular rhythm.  Lungs: Normal work of breathing. Neurologic: No focal deficits.   Lab Results  Component Value Date   CREATININE 0.51 03/11/2022   BUN 11 03/11/2022   NA 140 03/11/2022   K 4.3 03/11/2022   CL 105 03/11/2022   CO2 26 03/11/2022   Lab Results  Component Value Date   ALT 12 03/11/2022   AST 14 03/11/2022   ALKPHOS 91 03/11/2022   BILITOT 0.2 03/11/2022   Lab Results  Component  Value Date   HGBA1C 5.9 (H) 08/10/2022   HGBA1C 6.2 03/11/2022   No results found for: "INSULIN" Lab Results  Component Value Date   TSH 1.22 03/11/2022   Lab Results  Component Value Date   CHOL 151 03/11/2022   HDL 41.10 03/11/2022   LDLDIRECT 73.0 03/11/2022   TRIG 313.0 (H) 03/11/2022   CHOLHDL 4 03/11/2022   Lab Results  Component Value Date   VD25OH 33.2 04/20/2022   Lab Results  Component Value Date   WBC 15.0 (H) 03/11/2022   HGB 14.6 03/11/2022   HCT 45.0  03/11/2022   MCV 89.3 03/11/2022   PLT 269.0 03/11/2022   No results found for: "IRON", "TIBC", "FERRITIN"  Attestation Statements:   Reviewed by clinician on day of visit: allergies, medications, problem list, medical history, surgical history, family history, social history, and previous encounter notes.  .I have personally spent 40 minutes total time today in preparation, patient care, and documentation for this visit, including the following: review of clinical lab tests; review of medical tests/procedures/services.    I, Davy Pique, am acting as Location manager for Loyal Gambler, DO.  I have reviewed the above documentation for accuracy and completeness, and I agree with the above. Dell Ponto, DO

## 2022-08-25 ENCOUNTER — Ambulatory Visit (INDEPENDENT_AMBULATORY_CARE_PROVIDER_SITE_OTHER): Payer: Medicare Other | Admitting: Family Medicine

## 2022-08-25 ENCOUNTER — Encounter (INDEPENDENT_AMBULATORY_CARE_PROVIDER_SITE_OTHER): Payer: Self-pay | Admitting: Family Medicine

## 2022-08-25 VITALS — BP 110/74 | HR 84 | Temp 98.3°F | Ht 64.0 in | Wt 165.0 lb

## 2022-08-25 DIAGNOSIS — G4733 Obstructive sleep apnea (adult) (pediatric): Secondary | ICD-10-CM | POA: Diagnosis not present

## 2022-08-25 DIAGNOSIS — E669 Obesity, unspecified: Secondary | ICD-10-CM | POA: Diagnosis not present

## 2022-08-25 DIAGNOSIS — Z6828 Body mass index (BMI) 28.0-28.9, adult: Secondary | ICD-10-CM | POA: Diagnosis not present

## 2022-08-25 DIAGNOSIS — R7303 Prediabetes: Secondary | ICD-10-CM | POA: Diagnosis not present

## 2022-08-26 ENCOUNTER — Ambulatory Visit: Payer: Medicare Other | Admitting: Nurse Practitioner

## 2022-08-26 DIAGNOSIS — G4733 Obstructive sleep apnea (adult) (pediatric): Secondary | ICD-10-CM | POA: Insufficient documentation

## 2022-09-01 NOTE — Progress Notes (Signed)
Chief Complaint:   OBESITY Rachael Mosley is here to discuss her progress with her obesity treatment plan along with follow-up of her obesity related diagnoses. Rachael Mosley is on the [employer] and states she is following her eating plan approximately 90% of the time. Rachael Mosley states she is doing hand weights and bands 15 minutes 6 times per week.  Today's visit was #: 6 Starting weight: 188 LBS Starting date: 04/20/2022 Today's weight: 165 LBS Today's date: 08/25/2022 Total lbs lost to date: 23 LBS Total lbs lost since last in-office visit: 6 LBS  Interim History: Patient is doing well on pescatarian plan.  She is getting an fruits, veggies and water.  She is still snacking at night, but more some nights, usually sweets.  She is not triggered by stress.  She is working on Designer, fashion/clothing intake.  Subjective:   1. OSA (obstructive sleep apnea) Patient was recently diagnosed with OSA, awaiting CPAP.  Patient snores heavily.  2. Pre-diabetes Discussed labs with patient. A1c has improved to 5.9 from 6.2.  Labs reviewed from 08/10/2022.  23 pound weight loss in 4 months.  She is doing well on the metformin 1000 mg twice daily without any adverse side effects.  Assessment/Plan:   1. OSA (obstructive sleep apnea) Aim for 8 hours of sleep with CPAP.  Look for improvements in weight and energy levels  2. Pre-diabetes Continue to reduce intake of added sugar and increase regular exercise.  3. Obesity, current BMI 28.4 1.  Keep junk food out of the house. 2.  Add in bike or walking 3 times a week.  Rachael Mosley is currently in the action stage of change. As such, her goal is to continue with weight loss efforts. She has agreed to the [employer].   Exercise goals:  Add in 20 minutes of bike time 3 times a week  Behavioral modification strategies: increasing lean protein intake, increasing vegetables, increasing water intake, decreasing eating out, no skipping meals, meal planning and  cooking strategies, keeping healthy foods in the home, ways to avoid boredom eating, and planning for success.  Rachael Mosley has agreed to follow-up with our clinic in 4 weeks. She was informed of the importance of frequent follow-up visits to maximize her success with intensive lifestyle modifications for her multiple health conditions.   Objective:   Blood pressure 110/74, pulse 84, temperature 98.3 F (36.8 C), height '5\' 4"'$  (1.626 m), weight 165 lb (74.8 kg), last menstrual period 05/21/2020, SpO2 97 %. Body mass index is 28.32 kg/m.  General: Cooperative, alert, well developed, in no acute distress. HEENT: Conjunctivae and lids unremarkable. Cardiovascular: Regular rhythm.  Lungs: Normal work of breathing. Neurologic: No focal deficits.   Lab Results  Component Value Date   CREATININE 0.51 03/11/2022   BUN 11 03/11/2022   NA 140 03/11/2022   K 4.3 03/11/2022   CL 105 03/11/2022   CO2 26 03/11/2022   Lab Results  Component Value Date   ALT 12 03/11/2022   AST 14 03/11/2022   ALKPHOS 91 03/11/2022   BILITOT 0.2 03/11/2022   Lab Results  Component Value Date   HGBA1C 5.9 (H) 08/10/2022   HGBA1C 6.2 03/11/2022   No results found for: "INSULIN" Lab Results  Component Value Date   TSH 1.22 03/11/2022   Lab Results  Component Value Date   CHOL 151 03/11/2022   HDL 41.10 03/11/2022   LDLDIRECT 73.0 03/11/2022   TRIG 313.0 (H) 03/11/2022   CHOLHDL 4 03/11/2022  Lab Results  Component Value Date   VD25OH 33.2 04/20/2022   Lab Results  Component Value Date   WBC 15.0 (H) 03/11/2022   HGB 14.6 03/11/2022   HCT 45.0 03/11/2022   MCV 89.3 03/11/2022   PLT 269.0 03/11/2022   No results found for: "IRON", "TIBC", "FERRITIN"  Attestation Statements:   Reviewed by clinician on day of visit: allergies, medications, problem list, medical history, surgical history, family history, social history, and previous encounter notes.  I, Davy Pique, am acting as  Location manager for Loyal Gambler, DO.  I have reviewed the above documentation for accuracy and completeness, and I agree with the above. Dell Ponto, DO

## 2022-09-19 IMAGING — DX DG FOOT COMPLETE 3+V*L*
3 series · 3 of 3 positions shown · non-contrast
Comparison: None Available.

CLINICAL DATA: Wound to left fifth metatarsophalangeal area. Old
foreign body. Want to make sure no other. Spider bite 1 year ago
between fourth and fifth phalanges.

EXAM:
LEFT FOOT - COMPLETE 3+ VIEW

[foot ap]
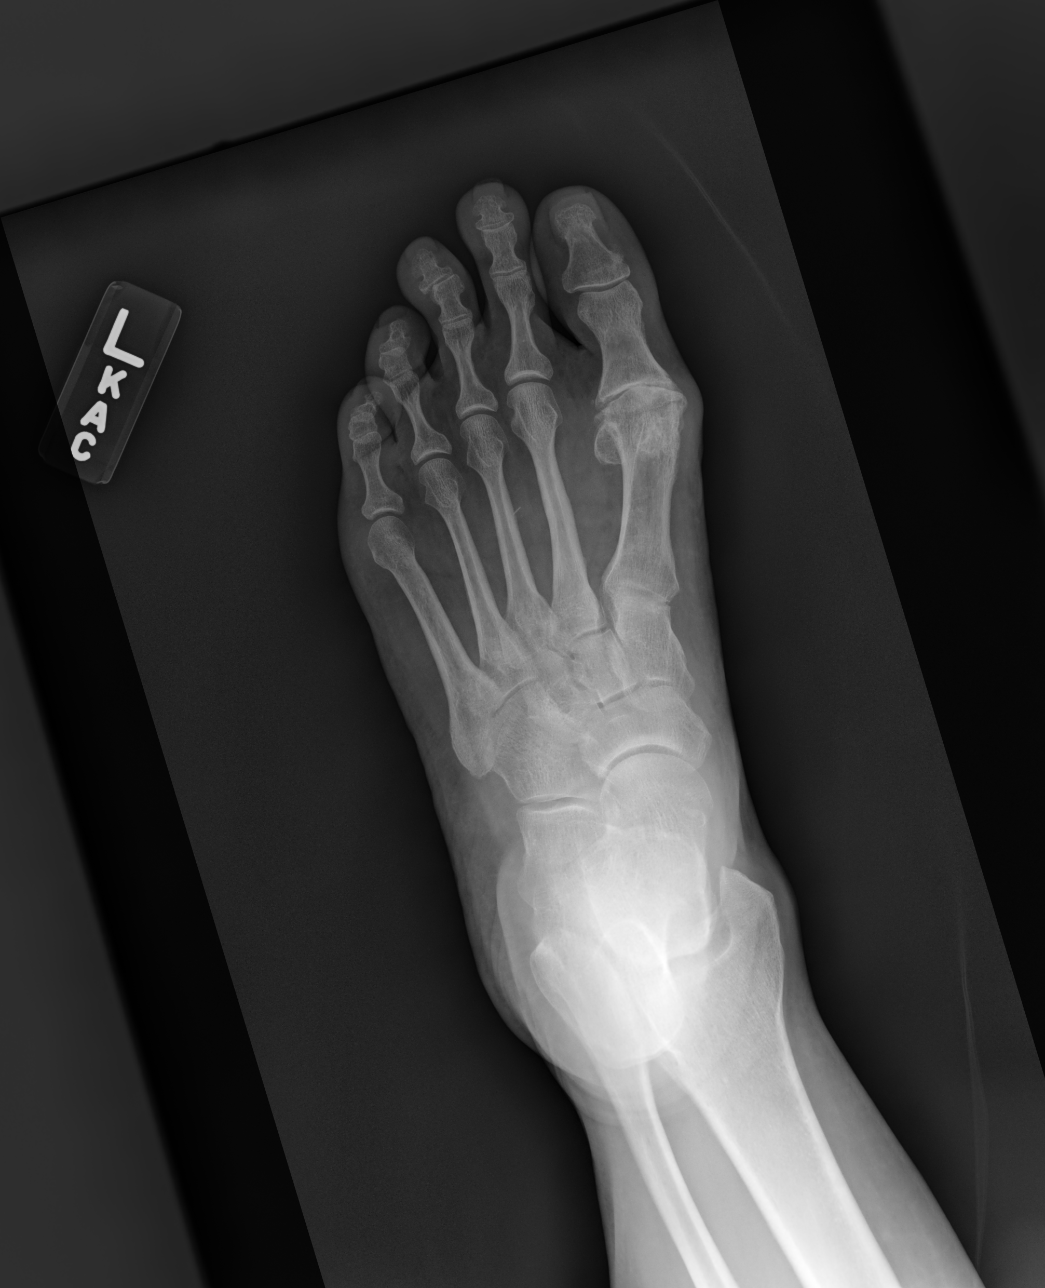

[foot mlo]
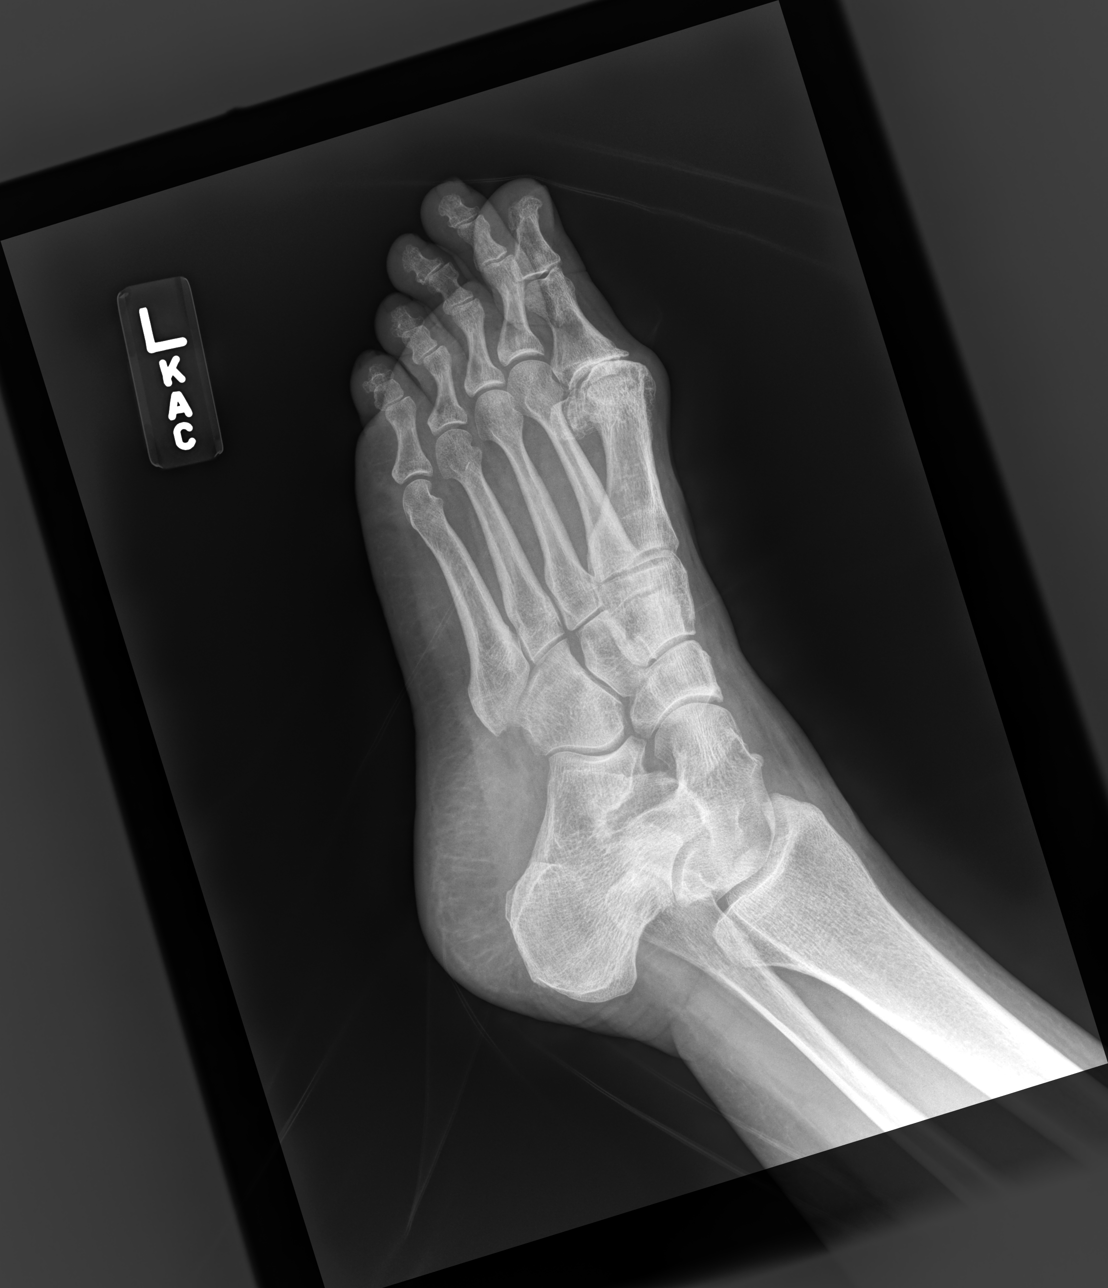

[foot lat]
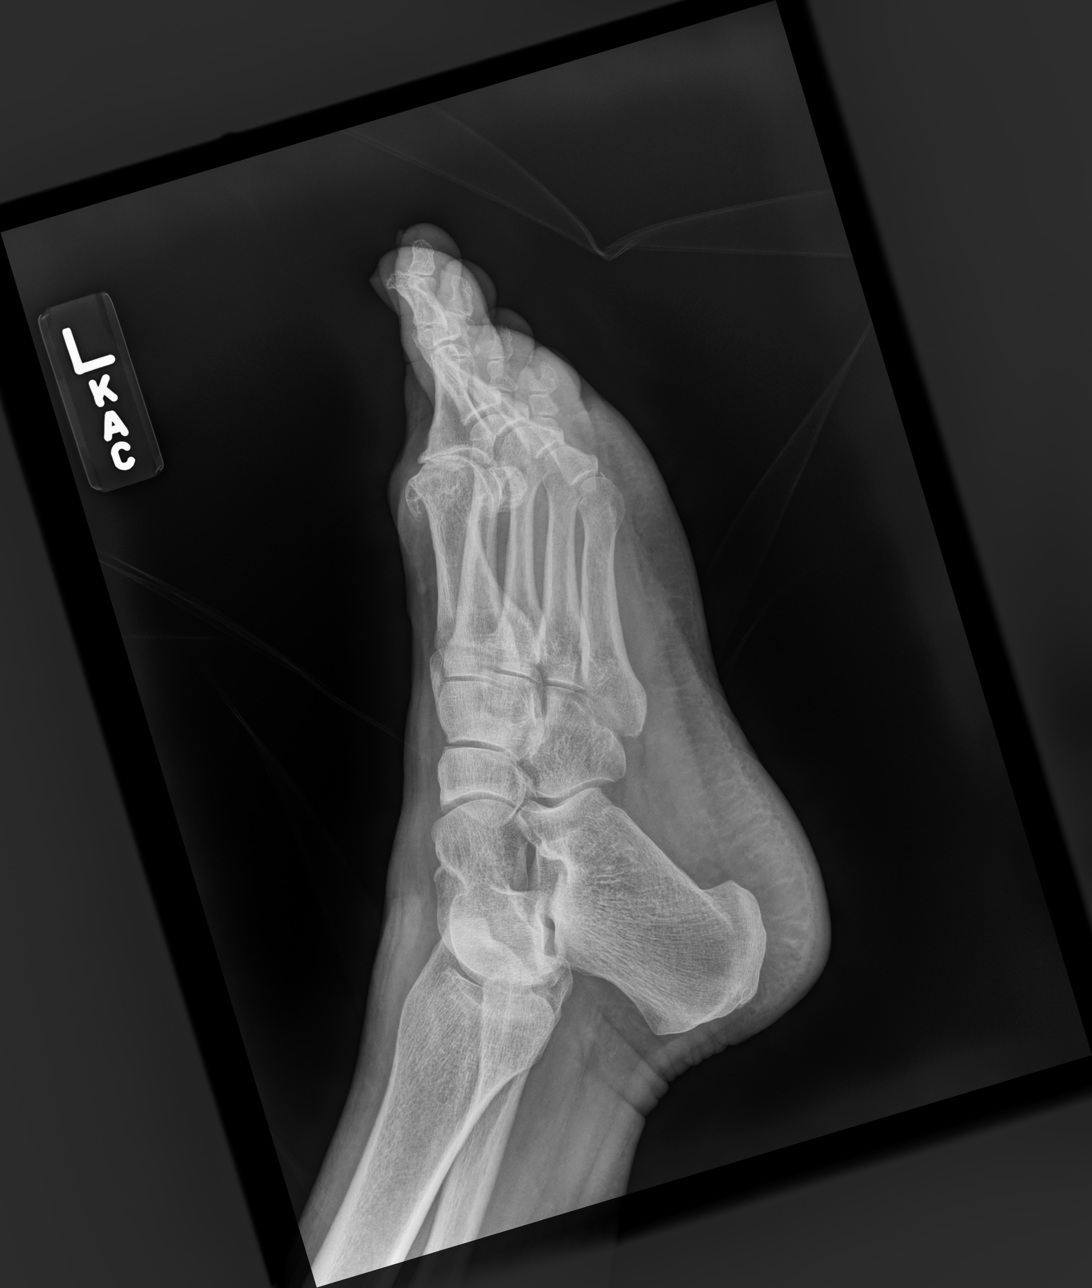

[3 of 3 positions shown; findings below may reference images not displayed]

FINDINGS: Severe great toe metatarsophalangeal joint space narrowing with
bone-on-bone contact and peripheral osteophytosis. Mild joint space
narrowing of the interphalangeal joints diffusely. No significant
soft tissue wound is seen.

There is a linear metallic density measuring up to approximately 3
by less than 1 mm overlying the soft tissues at the medial aspect of
the distal shaft of the third metatarsal on frontal view and likely
overlying the distal aspect of fifth metatarsal head plantar to the
third metatarsal on lateral view. This also overlies the proximal
fourth metatarsal on oblique view. This is favored to be within the
soft tissues plantar to the distal third metatarsal shaft.
IMPRESSION: 1. Severe great toe metatarsophalangeal joint osteoarthritis.
2. There is an apparent linear foreign body within the soft tissues
plantar to the distal shaft of third metatarsal, as described above.

## 2022-09-27 ENCOUNTER — Ambulatory Visit (INDEPENDENT_AMBULATORY_CARE_PROVIDER_SITE_OTHER): Payer: Medicare Other | Admitting: Family Medicine

## 2022-09-27 ENCOUNTER — Encounter (INDEPENDENT_AMBULATORY_CARE_PROVIDER_SITE_OTHER): Payer: Self-pay | Admitting: Family Medicine

## 2022-09-27 VITALS — BP 114/76 | HR 61 | Temp 98.2°F | Ht 64.0 in | Wt 163.0 lb

## 2022-09-27 DIAGNOSIS — E669 Obesity, unspecified: Secondary | ICD-10-CM | POA: Diagnosis not present

## 2022-09-27 DIAGNOSIS — R7303 Prediabetes: Secondary | ICD-10-CM

## 2022-09-27 DIAGNOSIS — Z6828 Body mass index (BMI) 28.0-28.9, adult: Secondary | ICD-10-CM

## 2022-09-27 DIAGNOSIS — E8881 Metabolic syndrome: Secondary | ICD-10-CM

## 2022-10-07 ENCOUNTER — Ambulatory Visit: Payer: Medicare Other | Admitting: Nurse Practitioner

## 2022-10-11 NOTE — Progress Notes (Signed)
Chief Complaint:   OBESITY Rachael Mosley is here to discuss her progress with her obesity treatment plan along with follow-up of her obesity related diagnoses. Rachael Mosley is on the [employer] and states she is following her eating plan approximately 60% of the time. Rachael Mosley states she is hand weights , bike and band it 30 minutes 4 times per week.  Today's visit was #: 7 Starting weight: 188 lbs Starting date: 04/20/2022 Today's weight: 163 lbs Today's date: 09/27/2022 Total lbs lost to date: 25 lbs Total lbs lost since last in-office visit: 2 lbs  Interim History: Patient has lost 13.2% TBW in 5 months of medically supervised weight management.  Patient complains of sugar cravings.  Has protein hot chocolate and greek yogurt in the mornings.  For lunch,  leftovers, fish, veggies, toasted cheese, salad for dinner.  Has 1 Yasso bar after dinner, nuts portioned out, cheese and crackers.   Subjective:   1. Pre-diabetes Last A1c 5.9.  Patient started metformin, taking 1000 mg BID.  Has some loose stools. Actively losing weight and working healthy lifestyle changes.   Assessment/Plan:   1. Pre-diabetes Continue metformin 1000 mg BID, has prescription.   2. Obesity,current BMI 28.1 Add workouts back in 4 times per week.  (Muscle mass 2.8 lb decrease)  Rachael Mosley is currently in the action stage of change. As such, her goal is to continue with weight loss efforts. She has agreed to the [employer].   Exercise goals:  As is.   Behavioral modification strategies: increasing lean protein intake, increasing vegetables, increasing water intake, decreasing eating out, no skipping meals, meal planning and cooking strategies, keeping healthy foods in the home, and planning for success.  Rachael Mosley has agreed to follow-up with our clinic in 4 weeks. She was informed of the importance of frequent follow-up visits to maximize her success with intensive lifestyle modifications for her multiple  health conditions.   Objective:   Blood pressure 114/76, pulse 61, temperature 98.2 F (36.8 C), height '5\' 4"'$  (1.626 m), weight 163 lb (73.9 kg), last menstrual period 05/21/2020, SpO2 93 %. Body mass index is 27.98 kg/m.  General: Cooperative, alert, well developed, in no acute distress. HEENT: Conjunctivae and lids unremarkable. Cardiovascular: Regular rhythm.  Lungs: Normal work of breathing. Neurologic: No focal deficits.   Lab Results  Component Value Date   CREATININE 0.51 03/11/2022   BUN 11 03/11/2022   NA 140 03/11/2022   K 4.3 03/11/2022   CL 105 03/11/2022   CO2 26 03/11/2022   Lab Results  Component Value Date   ALT 12 03/11/2022   AST 14 03/11/2022   ALKPHOS 91 03/11/2022   BILITOT 0.2 03/11/2022   Lab Results  Component Value Date   HGBA1C 5.9 (H) 08/10/2022   HGBA1C 6.2 03/11/2022   No results found for: "INSULIN" Lab Results  Component Value Date   TSH 1.22 03/11/2022   Lab Results  Component Value Date   CHOL 151 03/11/2022   HDL 41.10 03/11/2022   LDLDIRECT 73.0 03/11/2022   TRIG 313.0 (H) 03/11/2022   CHOLHDL 4 03/11/2022   Lab Results  Component Value Date   VD25OH 33.2 04/20/2022   Lab Results  Component Value Date   WBC 15.0 (H) 03/11/2022   HGB 14.6 03/11/2022   HCT 45.0 03/11/2022   MCV 89.3 03/11/2022   PLT 269.0 03/11/2022   No results found for: "IRON", "TIBC", "FERRITIN"  Attestation Statements:   Reviewed by clinician on day of visit:  allergies, medications, problem list, medical history, surgical history, family history, social history, and previous encounter notes.  I, Davy Pique, am acting as Location manager for Loyal Gambler, DO.  I have reviewed the above documentation for accuracy and completeness, and I agree with the above. Dell Ponto, DO

## 2022-10-12 ENCOUNTER — Encounter (INDEPENDENT_AMBULATORY_CARE_PROVIDER_SITE_OTHER): Payer: Self-pay

## 2022-10-12 ENCOUNTER — Encounter: Payer: Self-pay | Admitting: Nurse Practitioner

## 2022-10-13 NOTE — Telephone Encounter (Signed)
Needs a 1 month in office visit with me please

## 2022-10-14 MED ORDER — METFORMIN HCL ER 500 MG PO TB24: 1000.0000 mg | ORAL_TABLET | Freq: Two times a day (BID) | ORAL | 0 refills | Status: DC

## 2022-10-19 ENCOUNTER — Encounter: Payer: Self-pay | Admitting: Gastroenterology

## 2022-10-19 ENCOUNTER — Ambulatory Visit: Payer: Medicare Other | Admitting: Gastroenterology

## 2022-10-19 NOTE — Progress Notes (Deleted)
Gastroenterology Consultation  Referring Provider:     Michela Pitcher, NP Primary Care Physician:  Michela Pitcher, NP Primary Gastroenterologist:  Dr. Allen Norris     Reason for Consultation:     GERD        HPI:   Rachael Mosley is a 57 y.o. y/o female referred for consultation & management of GERD by Dr. Charmian Muff, Alyson Locket, NP.  This patient comes in today after being seen in the past by GI at [facility].  The patient had a colonoscopy in 2021 due to a family history of colon polyps and a upper endoscopy in 2019 for reflux not responding to medication and a family history of esophageal cancer.  The patient's colonoscopy was normal and the patient was recommended to have repeat colonoscopy in 7 years.  The patient's upper endoscopy showed:  Impression:          - Z-line, 38 cm from the incisors. Biopsied.                     - Salmon-colored mucosa. Biopsied.                     - No gross lesions in the stomach.                     - No gross lesions in the entire examined duodenum.   Biopsies at that time showed:  A: Esophagus, biopsy - Gastroesophageal mucosa with mild superficial chronic inactive gastritis - Negative for Helicobacter pylori by H&E stain - No intestinal metaplasia or dysplasia identified      Past Medical History:  Diagnosis Date   Anxiety    GERD (gastroesophageal reflux disease)    Hyperlipidemia    Hypertension     Past Surgical History:  Procedure Laterality Date   TONSILLECTOMY AND ADENOIDECTOMY      Prior to Admission medications   Medication Sig Start Date End Date Taking? Authorizing Provider  amLODipine (NORVASC) 10 MG tablet Take 1 tablet (10 mg total) by mouth daily. 07/05/22   Michela Pitcher, NP  ARIPiprazole (ABILIFY) 15 MG tablet Take 15 mg by mouth daily.    [provider]  atorvastatin (LIPITOR) 20 MG tablet Take 1 tablet (20 mg total) by mouth daily. 07/05/22   Michela Pitcher, NP  estradiol (ESTRACE) 0.1 MG/GM vaginal cream Insert 1g  nightly for 1 wk, then 1 g once weekly as maintenace 123456   Copland, Alicia B, PA-C  famotidine (PEPCID) 20 MG tablet Take 1 tablet (20 mg total) by mouth at bedtime. 06/14/22   Michela Pitcher, NP  gabapentin (NEURONTIN) 300 MG capsule Take 600 mg by mouth 2 (two) times daily.    [provider]  hydrOXYzine (ATARAX) 50 MG tablet Take by mouth. 06/29/22   [provider]  lamoTRIgine (LAMICTAL) 200 MG tablet Take 200 mg by mouth 2 (two) times daily.    [provider]  lidocaine (XYLOCAINE) 5 % ointment Apply small amount externally vaginally 20 minutes before sex; wipe off before intercourse [id number]   Copland, Alicia B, PA-C  metFORMIN (GLUCOPHAGE-XR) 500 MG 24 hr tablet Take 2 tablets (1,000 mg total) by mouth 2 (two) times daily with a meal. 10/14/22   Bowen, Collene Leyden, DO  metroNIDAZOLE (FLAGYL) 500 MG tablet Take 1 tab BID for 7 days; NO alcohol use for 10 days after prescription start [id number]   Copland, Deirdre Evener, PA-C  omeprazole (PRILOSEC) 40 MG capsule Take 1 capsule (40 mg total) by mouth daily. 07/05/22   Michela Pitcher, NP  venlafaxine (EFFEXOR) 37.5 MG tablet Take 375 mg by mouth daily.    [provider]    Family History  Problem Relation Age of Onset   Cancer Mother        FH maternal of esophegal cancer   Heart disease Father    Breast cancer Maternal Aunt 78   Uterine cancer Maternal Aunt 76   Heart disease Paternal Grandfather      Social History   Tobacco Use   Smoking status: Every Day    Packs/day: 1.00    Years: 25.00    Total pack years: 25.00    Types: Cigarettes   Smokeless tobacco: Never  Vaping Use   Vaping Use: Never used  Substance Use Topics   Alcohol use: Yes    Comment: 1-2 drinks per night wine    Allergies as of 10/19/2022   (No Known Allergies)    Review of Systems:    All systems reviewed and negative except where noted in HPI.   Physical Exam:  LMP 05/21/2020 (Approximate)  Patient's last menstrual  period was 05/21/2020 (approximate). General:   Alert,  Well-developed, ***well-nourished, pleasant and cooperative in NAD Head:  Normocephalic and atraumatic. Eyes:  Sclera clear, no icterus.   Conjunctiva pink. Ears:  Normal auditory acuity. Neck:  Supple; no masses or thyromegaly. Lungs:  Respirations even and unlabored.  Clear throughout to auscultation.   No wheezes, crackles, or rhonchi. No acute distress. Heart:  Regular rate and rhythm; no murmurs, clicks, rubs, or gallops. Abdomen:  Normal bowel sounds.  No bruits.  Soft, non-tender and non-distended without masses, hepatosplenomegaly or hernias noted.  No guarding or rebound tenderness.  ***Negative Carnett sign.   Rectal:  Deferred.***  Pulses:  Normal pulses noted. Extremities:  No clubbing or edema.  No cyanosis. Neurologic:  Alert and oriented x3;  grossly normal neurologically. Skin:  Intact without significant lesions or rashes.  ***No jaundice. Lymph Nodes:  No significant cervical adenopathy. Psych:  Alert and cooperative. Normal mood and affect.  Imaging Studies: No results found.  Assessment and Plan:   Rachael Mosley is a 57 y.o. y/o female ***    Lucilla Lame, MD. Marval Regal    Note: This dictation was prepared with Dragon dictation along with smaller phrase technology. Any transcriptional errors that result from this process are unintentional.

## 2022-10-21 ENCOUNTER — Encounter (INDEPENDENT_AMBULATORY_CARE_PROVIDER_SITE_OTHER): Payer: Self-pay | Admitting: Family Medicine

## 2022-10-21 ENCOUNTER — Ambulatory Visit (INDEPENDENT_AMBULATORY_CARE_PROVIDER_SITE_OTHER): Payer: Medicare Other | Admitting: Family Medicine

## 2022-10-21 VITALS — BP 114/77 | HR 63 | Temp 99.1°F | Ht 64.0 in | Wt 161.0 lb

## 2022-10-21 DIAGNOSIS — R7303 Prediabetes: Secondary | ICD-10-CM

## 2022-10-21 DIAGNOSIS — E669 Obesity, unspecified: Secondary | ICD-10-CM | POA: Diagnosis not present

## 2022-10-21 DIAGNOSIS — Z6827 Body mass index (BMI) 27.0-27.9, adult: Secondary | ICD-10-CM

## 2022-10-21 DIAGNOSIS — E782 Mixed hyperlipidemia: Secondary | ICD-10-CM

## 2022-10-21 DIAGNOSIS — E559 Vitamin D deficiency, unspecified: Secondary | ICD-10-CM

## 2022-10-21 MED ORDER — METFORMIN HCL ER 500 MG PO TB24: 1000.0000 mg | ORAL_TABLET | Freq: Two times a day (BID) | ORAL | 0 refills | Status: DC

## 2022-10-22 ENCOUNTER — Encounter: Payer: Medicare Other | Attending: Physician Assistant | Admitting: Physician Assistant

## 2022-10-22 DIAGNOSIS — I10 Essential (primary) hypertension: Secondary | ICD-10-CM | POA: Insufficient documentation

## 2022-10-22 DIAGNOSIS — T63301A Toxic effect of unspecified spider venom, accidental (unintentional), initial encounter: Secondary | ICD-10-CM | POA: Diagnosis present

## 2022-10-22 NOTE — Progress Notes (Signed)
[place], [place] ([id number]) [id number] Page 1 of 5 Visit Report for 10/22/2022 Abuse Risk Screen Details Patient Name: Date of Service: Rachael Mosley [facility] 10/22/2022 11:30 A M Medical Record Number: [id number] Patient Account Number: [id number] Date of Birth/Sex: Treating RN: [birth year] (57 y.o. Orvan Falconer Primary Care Mauria Asquith: Karl Ito Other Clinician: Referring Marleta Lapierre: Treating Greco Gastelum/Extender: Elmer Ramp in Treatment: 0 Abuse Risk Screen Items Answer ABUSE RISK SCREEN: Has anyone close to you tried to hurt or harm you recentlyo No Do you feel uncomfortable with anyone in your familyo No Has anyone forced you do things that you didnt want to doo No Electronic Signature(s) Signed: 10/22/2022 11:51:39 AM By: Carlene Coria RN Entered By: Carlene Coria on 10/22/2022 11:25:51 -------------------------------------------------------------------------------- Activities of Daily Living Details Patient Name: Date of Service: Rachael Mosley [facility] 10/22/2022 11:30 [place] Record Number: [id number] Patient Account Number: [id number] Date of Birth/Sex: Treating RN: 1966-03-27 (57 y.o. Orvan Falconer Primary Care Param Capri: Karl Ito Other Clinician: Referring Cylee Dattilo: Treating Cherlyn Syring/Extender: Elmer Ramp in Treatment: 0 Activities of Daily Living Items Answer Activities of Daily Living (Please select one for each item) Drive Automobile Completely Able T Medications ake Completely Able Use T elephone Completely Able Care for Appearance Completely Able Use T oilet Completely Able Bath / Shower Completely Able Dress Self Completely Able Feed Self Completely Able Walk Completely Able Get In / Out Bed Completely Able Housework Completely [place], [place] ([id number]) [phone] [id number].pdf Page 2 of 5 Prepare Meals Completely Able Handle Money Completely Able Shop for Self  Completely Able Electronic Signature(s) Signed: 10/22/2022 11:51:39 AM By: Carlene Coria RN Entered By: Carlene Coria on 10/22/2022 11:26:20 -------------------------------------------------------------------------------- Education Screening Details Patient Name: Date of Service: Rachael Mosley [facility] 10/22/2022 11:30 [place] Record Number: [id number] Patient Account Number: [id number] Date of Birth/Sex: Treating RN: [birth year] (57 y.o. Orvan Falconer Primary Care Eden Toohey: Karl Ito Other Clinician: Referring Lashana Spang: Treating Aunisty Reali/Extender: Elmer Ramp in Treatment: 0 Primary Learner Assessed: Patient Learning Preferences/Education Level/Primary Language Learning Preference: Explanation Highest Education Level: College or Above Preferred Language: English Cognitive Barrier Language Barrier: No Translator Needed: No Memory Deficit: No Emotional Barrier: No Cultural/Religious Beliefs Affecting Medical Care: No Physical Barrier Impaired Vision: Yes Glasses Impaired Hearing: No Decreased Hand dexterity: No Knowledge/Comprehension Knowledge Level: High Comprehension Level: High Ability to understand written instructions: High Ability to understand verbal instructions: High Motivation Anxiety Level: Anxious Cooperation: Cooperative Education Importance: Acknowledges Need Interest in Health Problems: Asks Questions Perception: Coherent Willingness to Engage in Self-Management High Activities: Readiness to Engage in Self-Management High Activities: Electronic Signature(s) Signed: 10/22/2022 11:51:39 AM By: Carlene Coria RN Entered By: Carlene Coria on 10/22/2022 11:26:53 Kirkpatrick, Julien ([id number]) [id number] Page 3 of 5 -------------------------------------------------------------------------------- Fall Risk Assessment Details Patient Name: Date of Service: Rachael Mosley [facility] 10/22/2022 11:30 A M Medical Record Number:  [id number] Patient Account Number: [id number] Date of Birth/Sex: Treating RN: 1966/02/25 (57 y.o. Orvan Falconer Primary Care Taniqua Issa: Karl Ito Other Clinician: Referring Demontay Grantham: Treating Jasenia Weilbacher/Extender: Elmer Ramp in Treatment: 0 Fall Risk Assessment Items Have you had 2 or more falls in the last 12 monthso 0 No Have you had any fall that resulted in injury in the last 12 monthso 0 No FALLS RISK SCREEN History of falling - immediate or within 3 months 0 No Secondary diagnosis (Do you have 2 or more medical diagnoseso) 0 No Ambulatory aid None/bed rest/wheelchair/nurse 0 Yes  Crutches/cane/walker 0 No Furniture 0 No Intravenous therapy Access/Saline/Heparin Lock 0 No Gait/Transferring Normal/ bed rest/ wheelchair 0 Yes Weak (short steps with or without shuffle, stooped but able to lift head while walking, may seek 0 No support from furniture) Impaired (short steps with shuffle, may have difficulty arising from chair, head down, impaired 0 No balance) Mental Status Oriented to own ability 0 Yes Electronic Signature(s) Signed: 10/22/2022 11:51:39 AM By: Carlene Coria RN Entered By: Carlene Coria on 10/22/2022 11:27:07 -------------------------------------------------------------------------------- Foot Assessment Details Patient Name: Date of Service: Rachael Mosley [facility] 10/22/2022 11:30 [place] Record Number: [id number] Patient Account Number: [id number] Date of Birth/Sex: Treating RN: 04-21-1966 (57 y.o. Orvan Falconer Primary Care Sundeep Destin: Karl Ito Other Clinician: Referring Yzabella Crunk: Treating Darlette Dubow/Extender: Elmer Ramp in Treatment: 0 Foot Assessment Items Site Locations [place], [place] ([id number]) [phone] [id number].pdf Page 4 of 5 + = Sensation present, - = Sensation absent, C = Callus, U = Ulcer R = Redness, W = Warmth, M = Maceration, PU = Pre-ulcerative lesion F = Fissure, S = Swelling, D  = Dryness Assessment Right: Left: Other Deformity: No No Prior Foot Ulcer: No No Prior Amputation: No No Charcot Joint: No No Ambulatory Status: Ambulatory Without Help Gait: Steady Electronic Signature(s) Signed: 10/22/2022 11:51:39 AM By: Carlene Coria RN Entered By: Carlene Coria on 10/22/2022 11:28:23 -------------------------------------------------------------------------------- Nutrition Risk Screening Details Patient Name: Date of Service: Rachael Mosley [facility] 10/22/2022 11:30 A M Medical Record Number: [id number] Patient Account Number: [id number] Date of Birth/Sex: Treating RN: 1966-08-19 (57 y.o. Orvan Falconer Primary Care Brighten Orndoff: Karl Ito Other Clinician: Referring Koraline Phillipson: Treating Shariah Assad/Extender: Elmer Ramp in Treatment: 0 Height (in): 64 Weight (lbs): 160 Body Mass Index (BMI): 27.5 Nutrition Risk Screening Items Score Screening NUTRITION RISK SCREEN: I have an illness or condition that made me change the kind and/or amount of food I eat 0 No I eat fewer than two meals per day 0 No I eat few fruits and vegetables, or milk products 0 No I have three or more drinks of beer, liquor or wine almost every day 0 No I have tooth or mouth problems that make it hard for me to eat 0 No I don't always have enough money to buy the food I need 0 No Stevens, Rumaisa ([id number]) [phone] [id number].pdf Page 5 of 5 I eat alone most of the time 0 No I take three or more different prescribed or over-the-counter drugs a day 1 Yes Without wanting to, I have lost or gained 10 pounds in the last six months 0 No I am not always physically able to shop, cook and/or feed myself 0 No Nutrition Protocols Good Risk Protocol 0 No interventions needed Moderate Risk Protocol High Risk Proctocol Risk Level: Good Risk Score: 1 Electronic Signature(s) Signed: 10/22/2022 11:51:39 AM By: Carlene Coria RN Entered By: Carlene Coria on 10/22/2022  11:27:20

## 2022-10-22 NOTE — Progress Notes (Signed)
[place], [place] ([id number]) [id number] Page 1 of 7 Visit Report for 10/22/2022 Allergy List Details Patient Name: Date of Service: Rachael Mosley [facility] 10/22/2022 11:30 A M Medical Record Number: [id number] Patient Account Number: [id number] Date of Birth/Sex: Treating RN: Mar 31, 1966 (57 y.o. Orvan Falconer Primary Care Yaqub Arney: Karl Ito Other Clinician: Referring Janell Keeling: Treating Rebeca Valdivia/Extender: Elmer Ramp in Treatment: 0 Allergies Active Allergies No Known Allergies Allergy Notes Electronic Signature(s) Signed: 10/22/2022 11:51:39 AM By: Carlene Coria RN Entered By: Carlene Coria on 10/22/2022 11:25:08 -------------------------------------------------------------------------------- Arrival Information Details Patient Name: Date of Service: Rachael Mosley, [place] 10/22/2022 11:30 A M Medical Record Number: [id number] Patient Account Number: [id number] Date of Birth/Sex: Treating RN: 04/08/1966 (57 y.o. Orvan Falconer Primary Care Camielle Sizer: Karl Ito Other Clinician: Referring Garek Schuneman: Treating Lourine Alberico/Extender: Elmer Ramp in Treatment: 0 Visit Information Patient Arrived: Ambulatory Arrival Time: 11:20 Accompanied By: self Transfer Assistance: None Patient Identification Verified: Yes Secondary Verification Process Completed: Yes Patient Requires Transmission-Based Precautions: No Patient Has Alerts: Yes Patient Alerts: ABI left.81 07/28/22 History Since Last Visit Added or deleted any medications: No Any new allergies or adverse reactions: No Had a fall or experienced change in activities of daily living that may affect risk of falls: No Signs or symptoms of abuse/neglect since last visito No Hospitalized since last visit: No Implantable device outside of the clinic excluding cellular tissue based products placed in the center since last visit: No Pain Present Now: No Electronic Signature(s) [place],  Kiara ([id number]) [id number] Page 2 of 7 Signed: 10/22/2022 11:51:39 AM By: Carlene Coria RN Entered By: Carlene Coria on 10/22/2022 11:40:24 -------------------------------------------------------------------------------- Clinic Level of Care Assessment Details Patient Name: Date of Service: Rachael Mosley [facility] 10/22/2022 11:30 A M Medical Record Number: [id number] Patient Account Number: [id number] Date of Birth/Sex: Treating RN: 11/13/1965 (57 y.o. Orvan Falconer Primary Care Bernise Sylvain: Karl Ito Other Clinician: Referring Evlyn Amason: Treating Cohan Stipes/Extender: Elmer Ramp in Treatment: 0 Clinic Level of Care Assessment Items TOOL 2 Quantity Score X- 1 0 Use when only an EandM is performed on the INITIAL visit ASSESSMENTS - Nursing Assessment / Reassessment X- 1 20 General Physical Exam (combine w/ comprehensive assessment (listed just below) when performed on new pt. evals) X- 1 25 Comprehensive Assessment (HX, ROS, Risk Assessments, Wounds Hx, etc.) ASSESSMENTS - Wound and Skin A ssessment / Reassessment '[]'$  - 0 Simple Wound Assessment / Reassessment - one wound '[]'$  - 0 Complex Wound Assessment / Reassessment - multiple wounds '[]'$  - 0 Dermatologic / Skin Assessment (not related to wound area) ASSESSMENTS - Ostomy and/or Continence Assessment and Care '[]'$  - 0 Incontinence Assessment and Management '[]'$  - 0 Ostomy Care Assessment and Management (repouching, etc.) PROCESS - Coordination of Care '[]'$  - 0 Simple Patient / Family Education for ongoing care '[]'$  - 0 Complex (extensive) Patient / Family Education for ongoing care '[]'$  - 0 Staff obtains Programmer, systems, Records, T Results / Process Orders est '[]'$  - 0 Staff telephones HHA, Nursing Homes / Clarify orders / etc '[]'$  - 0 Routine Transfer to another Facility (non-emergent condition) '[]'$  - 0 Routine Hospital Admission (non-emergent condition) '[]'$  - 0 New Admissions / Biomedical engineer  / Ordering NPWT Apligraf, etc. , '[]'$  - 0 Emergency Hospital Admission (emergent condition) '[]'$  - 0 Simple Discharge Coordination '[]'$  - 0 Complex (extensive) Discharge Coordination PROCESS - Special Needs '[]'$  - 0 Pediatric / Minor Patient Management '[]'$  - 0 Isolation Patient Management '[]'$  - 0 Hearing / Language /  Visual special needs '[]'$  - 0 Assessment of Community assistance (transportation, D/C planning, etc.) '[]'$  - 0 Additional assistance / Altered mentation [place], [place] ([id number]) [id number] Page 3 of 7 '[]'$  - 0 Support Surface(s) Assessment (bed, cushion, seat, etc.) INTERVENTIONS - Wound Cleansing / Measurement '[]'$  - 0 Wound Imaging (photographs - any number of wounds) '[]'$  - 0 Wound Tracing (instead of photographs) '[]'$  - 0 Simple Wound Measurement - one wound '[]'$  - 0 Complex Wound Measurement - multiple wounds '[]'$  - 0 Simple Wound Cleansing - one wound '[]'$  - 0 Complex Wound Cleansing - multiple wounds INTERVENTIONS - Wound Dressings '[]'$  - 0 Small Wound Dressing one or multiple wounds '[]'$  - 0 Medium Wound Dressing one or multiple wounds '[]'$  - 0 Large Wound Dressing one or multiple wounds '[]'$  - 0 Application of Medications - injection INTERVENTIONS - Miscellaneous '[]'$  - 0 External ear exam '[]'$  - 0 Specimen Collection (cultures, biopsies, blood, body fluids, etc.) '[]'$  - 0 Specimen(s) / Culture(s) sent or taken to Lab for analysis '[]'$  - 0 Patient Transfer (multiple staff / Civil Service fast streamer / Similar devices) '[]'$  - 0 Simple Staple / Suture removal (25 or less) '[]'$  - 0 Complex Staple / Suture removal (26 or more) '[]'$  - 0 Hypo / Hyperglycemic Management (close monitor of Blood Glucose) '[]'$  - 0 Ankle / Brachial Index (ABI) - do not check if billed separately Has the patient been seen at the hospital within the last three years: Yes Total Score: 45 Level Of Care: New/Established - Level 2 Electronic Signature(s) Signed: 10/22/2022 11:51:39 AM By: Carlene Coria  RN Entered By: Carlene Coria on 10/22/2022 11:43:34 -------------------------------------------------------------------------------- Encounter Discharge Information Details Patient Name: Date of Service: Rachael Mosley, [facility] 10/22/2022 11:30 [place] Record Number: [id number] Patient Account Number: [id number] Date of Birth/Sex: Treating RN: 08-07-1966 (57 y.o. Orvan Falconer Primary Care Makiyla Linch: Karl Ito Other Clinician: Referring Lyann Hagstrom: Treating Destin Kittler/Extender: Elmer Ramp in Treatment: 0 Encounter Discharge Information Items Discharge Condition: Stable Ambulatory Status: Ambulatory Discharge Destination: Home Transportation: Private Auto Accompanied By: self Schedule Follow-up Appointment: Yes Clinical Summary of Care: [place], Curt Bears ([id number]) [id number] Page 4 of 7 Electronic Signature(s) Signed: 10/22/2022 11:51:39 AM By: Carlene Coria RN Entered By: Carlene Coria on 10/22/2022 11:45:17 -------------------------------------------------------------------------------- Lower Extremity Assessment Details Patient Name: Date of Service: Rachael Mosley [facility] 10/22/2022 11:30 A M Medical Record Number: [id number] Patient Account Number: [id number] Date of Birth/Sex: Treating RN: 1966-06-30 (57 y.o. Orvan Falconer Primary Care Wren Pryce: Karl Ito Other Clinician: Referring Kaeden Mester: Treating Sacha Radloff/Extender: Elmer Ramp in Treatment: 0 Electronic Signature(s) Signed: 10/22/2022 11:51:39 AM By: Carlene Coria RN Entered By: Carlene Coria on 10/22/2022 11:25:04 -------------------------------------------------------------------------------- Multi Wound Chart Details Patient Name: Date of Service: Rachael Mosley, [place] 10/22/2022 11:30 A M Medical Record Number: [id number] Patient Account Number: [id number] Date of Birth/Sex: Treating RN: [birth year] (57 y.o. Orvan Falconer Primary Care Kimmarie Pascale: Karl Ito Other  Clinician: Referring Doneisha Ivey: Treating Jeriyah Granlund/Extender: Elmer Ramp in Treatment: 0 Vital Signs Height(in): 64 Pulse(bpm): 75 Weight(lbs): 160 Blood Pressure(mmHg): 130/82 Body Mass Index(BMI): 27.5 Temperature(F): 98.5 Respiratory Rate(breaths/min): 18 [Treatment Notes:Wound Assessments Treatment Notes] Electronic Signature(s) Signed: 10/22/2022 11:51:39 AM By: Carlene Coria RN Entered By: Carlene Coria on 10/22/2022 11:28:37 Clarkson, Jaclynn ([id number]) [id number] Page 5 of 7 -------------------------------------------------------------------------------- Multi-Disciplinary Care Plan Details Patient Name: Date of Service: Rachael Mosley [facility] 10/22/2022 11:30 A M Medical Record Number: [id number] Patient Account Number: [id number] Date of Birth/Sex: Treating RN: 06/24/1966 (  57 y.o. Orvan Falconer Primary Care Lemario Chaikin: Karl Ito Other Clinician: Referring Eriko Economos: Treating Rubby Barbary/Extender: Elmer Ramp in Treatment: 0 Active Inactive Electronic Signature(s) Signed: 10/22/2022 11:51:39 AM By: Carlene Coria RN Entered By: Carlene Coria on 10/22/2022 11:44:42 -------------------------------------------------------------------------------- Pain Assessment Details Patient Name: Date of Service: Rachael Mosley [facility] 10/22/2022 11:30 A M Medical Record Number: [id number] Patient Account Number: [id number] Date of Birth/Sex: Treating RN: 01/10/1966 (57 y.o. Orvan Falconer Primary Care Andilyn Bettcher: Karl Ito Other Clinician: Referring Hisae Decoursey: Treating Trashawn Oquendo/Extender: Elmer Ramp in Treatment: 0 Active Problems Location of Pain Severity and Description of Pain Patient Has Paino No Site [place], [place] ([id number]) [phone].pdf Page 6 of 7 Pain Management and Medication Current Pain Management: Electronic Signature(s) Signed: 10/22/2022 11:51:39 AM By: Carlene Coria RN Entered By: Carlene Coria on 10/22/2022 11:24:25 -------------------------------------------------------------------------------- Patient/Caregiver Education Details Patient Name: Date of Service: Rachael Mosley, [facility] 2/2/2024andnbsp11:30 A M Medical Record Number: [id number] Patient Account Number: [id number] Date of Birth/Gender: Treating RN: September 13, 1966 (57 y.o. Orvan Falconer Primary Care Physician: Karl Ito Other Clinician: Referring Physician: Treating Physician/Extender: Elmer Ramp in Treatment: 0 Education Assessment Education Provided To: Patient Education Topics Provided Welcome T [facility] Packet: o Methods: Explain/Verbal Responses: State content correctly Electronic Signature(s) Signed: 10/22/2022 11:51:39 AM By: Carlene Coria RN Entered By: Carlene Coria on 10/22/2022 11:43:51 -------------------------------------------------------------------------------- [place] Details Patient Name: Date of Service: Rachael Mosley, [place] 10/22/2022 11:30 A M Medical Record Number: [id number] Patient Account Number: [id number] Date of Birth/Sex: Treating RN: [birth year] (57 y.o. Orvan Falconer Primary Care Tayjah Lobdell: Karl Ito Other Clinician: Referring Theordore Cisnero: Treating Brooklee Michelin/Extender: Elmer Ramp in Treatment: 0 Vital Signs Time Taken: 11:24 Temperature (F): 98.5 [place], [place] ([id number]) [phone].pdf Page 7 of 7 Height (in): 64 Pulse (bpm): 75 Source: Stated Respiratory Rate (breaths/min): 18 Weight (lbs): 160 Blood Pressure (mmHg): 130/82 Source: Stated Reference Range: 80 - 120 mg / dl Body Mass Index (BMI): 27.5 Electronic Signature(s) Signed: 10/22/2022 11:51:39 AM By: Carlene Coria RN Entered By: Carlene Coria on 10/22/2022 11:24:52

## 2022-10-22 NOTE — Progress Notes (Signed)
[place], [place] ([id number]) [id number] Page 1 of 7 Visit Report for 10/22/2022 Chief Complaint Document Details Patient Name: Date of Service: Rachael Mosley [facility] 10/22/2022 11:30 A M Medical Record Number: [id number] Patient Account Number: [id number] Date of Birth/Sex: Treating RN: [birth year] (57 y.o. Orvan Falconer Primary Care Provider: Karl Ito Other Clinician: Referring Provider: Treating Provider/Extender: Elmer Ramp in Treatment: 0 Information Obtained from: Patient Chief Complaint Continued concern for spider bite that occurred 1.5 years ago between the fourth and fifth left toes Electronic Signature(s) Signed: 10/22/2022 11:37:26 AM By: Worthy Keeler PA-C Entered By: Worthy Keeler on 10/22/2022 11:37:26 -------------------------------------------------------------------------------- HPI Details Patient Name: Date of Service: Rachael Mosley [facility] 10/22/2022 11:30 [place] Record Number: [id number] Patient Account Number: [id number] Date of Birth/Sex: Treating RN: [birth year] (57 y.o. Orvan Falconer Primary Care Provider: Karl Ito Other Clinician: Referring Provider: Treating Provider/Extender: Elmer Ramp in Treatment: 0 History of Present Illness HPI Description: 07/28/2022 Rachael Mosley is a 57 year old female with a past medical history of alcohol use disorder, bipolar disorder, tobacco use disorder and hypertension that presents to the clinic for a previous spider bite site. She states that this occurred a year and a half ago and the area has slowly been healing. She reports seeing the spider bite her but is unclear what it looks like. She has not been placing anything on The spider bite site. She she denies drainage from the site. She denies signs of infection. Readmission: 10-22-2022 upon evaluation today patient presents for readmission here in the clinic she was seen actually once by Dr.  Heber [place] July 28, 2022. This is due to an area on her left foot between the fourth and fifth toe in the webspace where she previously had what she believes to be an insect bite. She tells me she did not see the spider on her foot but in the room at the same time that this issue happened with her foot. Nonetheless she has had issues for about a year and a half going on 2 years now since that time. She does tell me that with regard to her wound site she unfortunately 2 weeks ago began to have redness and erythema followed by drainage of which she got about 2 teaspoons of fluid out that was puslike. Subsequently she tells me that she ended up calling her primary care provider where they advised her to call us for an appointment. She therefore has been managing this on her own at home up to that point. Subsequently when she comes in today this appears to be completely healed which is great news. I did actually thoroughly evaluate the area to see if there is anything else going on. [place], [place] ([id number]) [id number] Page 2 of 7 Patient's past medical history has not changed since her last visit here. Electronic Signature(s) Signed: 10/22/2022 11:56:01 AM By: Worthy Keeler PA-C Entered By: Worthy Keeler on 10/22/2022 11:56:01 -------------------------------------------------------------------------------- Physical Exam Details Patient Name: Date of Service: Rachael Mosley [facility] 10/22/2022 11:30 A M Medical Record Number: [id number] Patient Account Number: [id number] Date of Birth/Sex: Treating RN: 06/24/1966 (57 y.o. Orvan Falconer Primary Care Provider: Karl Ito Other Clinician: Referring Provider: Treating Provider/Extender: Elmer Ramp in Treatment: 0 Constitutional sitting or standing blood pressure is within target range for patient.. pulse regular and within target range for patient.Marland Kitchen respirations regular, non-labored and within target  range for patient.Marland Kitchen temperature within target range for patient.Marland Kitchen  Well-nourished and well-hydrated in no acute distress. Eyes conjunctiva clear no eyelid edema noted. pupils equal round and reactive to light and accommodation. Ears, Nose, Mouth, and Throat no gross abnormality of ear auricles or external auditory canals. normal hearing noted during conversation. mucus membranes moist. Respiratory normal breathing without difficulty. Cardiovascular 2+ dorsalis pedis/posterior tibialis pulses. no clubbing, cyanosis, significant edema, <3 sec cap refill. Musculoskeletal normal gait and posture. no significant deformity or arthritic changes, no loss or range of motion, no clubbing. Psychiatric this patient is able to make decisions and demonstrates good insight into disease process. Alert and Oriented x 3. pleasant and cooperative. Notes Upon inspection patient's wound area of which I did thoroughly evaluate today does not show any signs of an opening right now there also does not appear to be any evidence of active infection at this point. All this is good news. With that being said I did recommend based on what I am seeing that we have her continue to monitor for any signs of infection or worsening. Electronic Signature(s) Signed: 10/22/2022 1:50:59 PM By: Worthy Keeler PA-C Entered By: Worthy Keeler on 10/22/2022 13:50:59 Physician Orders Details -------------------------------------------------------------------------------- [place], [place] ([id number]) [id number] Page 3 of 7 Patient Name: Date of Service: Rachael Mosley [facility] 10/22/2022 11:30 A M Medical Record Number: [id number] Patient Account Number: [id number] Date of Birth/Sex: Treating RN: 1966-02-20 (57 y.o. Orvan Falconer Primary Care Provider: Karl Ito Other Clinician: Referring Provider: Treating Provider/Extender: Elmer Ramp in Treatment: 0 Verbal / Phone Orders:  No Diagnosis Coding ICD-10 Coding Code Description L98.8 Other specified disorders of the skin and subcutaneous tissue I10 Essential (primary) hypertension F10.29 Alcohol dependence with unspecified alcohol-induced disorder F31.32 Bipolar disorder, current episode depressed, moderate Discharge From [facility] Consult Only Electronic Signature(s) Signed: 10/22/2022 11:51:39 AM By: Carlene Coria RN Signed: 10/22/2022 2:41:09 PM By: Worthy Keeler PA-C Entered By: Carlene Coria on 10/22/2022 11:43:15 -------------------------------------------------------------------------------- Problem List Details Patient Name: Date of Service: Rachael Mosley, Rachael Mosley [facility] 10/22/2022 11:30 [place] Record Number: [id number] Patient Account Number: [id number] Date of Birth/Sex: Treating RN: September 03, 1966 (57 y.o. Orvan Falconer Primary Care Provider: Karl Ito Other Clinician: Referring Provider: Treating Provider/Extender: Elmer Ramp in Treatment: 0 Active Problems ICD-10 Encounter Code Description Active Date MDM Diagnosis L98.8 Other specified disorders of the skin and subcutaneous tissue 10/22/2022 No Yes I10 Essential (primary) hypertension 10/22/2022 No Yes F10.29 Alcohol dependence with unspecified alcohol-induced disorder 10/22/2022 No Yes F31.32 Bipolar disorder, current episode depressed, moderate 10/22/2022 No Yes Inactive Problems [place], [place] ([id number]) [id number] Page 4 of 7 Resolved Problems Electronic Signature(s) Signed: 10/22/2022 11:36:21 AM By: Worthy Keeler PA-C Entered By: Worthy Keeler on 10/22/2022 11:36:21 -------------------------------------------------------------------------------- Progress Note Details Patient Name: Date of Service: Rachael Mosley [facility] 10/22/2022 11:30 A M Medical Record Number: [id number] Patient Account Number: [id number] Date of Birth/Sex: Treating RN: Jun 24, 1966 (57 y.o. Orvan Falconer Primary Care  Provider: Karl Ito Other Clinician: Referring Provider: Treating Provider/Extender: Elmer Ramp in Treatment: 0 Subjective Chief Complaint Information obtained from Patient Continued concern for spider bite that occurred 1.5 years ago between the fourth and fifth left toes History of Present Illness (HPI) 07/28/2022 Rachael Mosley is a 58 year old female with a past medical history of alcohol use disorder, bipolar disorder, tobacco use disorder and hypertension that presents to the clinic for a previous spider bite site. She states that this occurred a year and a half ago and  the area has slowly been healing. She reports seeing the spider bite her but is unclear what it looks like. She has not been placing anything on The spider bite site. She she denies drainage from the site. She denies signs of infection. Readmission: 10-22-2022 upon evaluation today patient presents for readmission here in the clinic she was seen actually once by Dr. Heber [place] July 28, 2022. This is due to an area on her left foot between the fourth and fifth toe in the webspace where she previously had what she believes to be an insect bite. She tells me she did not see the spider on her foot but in the room at the same time that this issue happened with her foot. Nonetheless she has had issues for about a year and a half going on 2 years now since that time. She does tell me that with regard to her wound site she unfortunately 2 weeks ago began to have redness and erythema followed by drainage of which she got about 2 teaspoons of fluid out that was puslike. Subsequently she tells me that she ended up calling her primary care provider where they advised her to call us for an appointment. She therefore has been managing this on her own at home up to that point. Subsequently when she comes in today this appears to be completely healed which is great news. I did actually thoroughly evaluate the area  to see if there is anything else going on. Patient's past medical history has not changed since her last visit here. Patient History Allergies No Known Allergies Social History Former smoker, Marital Status - Married, Alcohol Use - Rarely, Drug Use - Current History - weed, Caffeine Use - Daily. Medical History Cardiovascular Patient has history of Hypertension Medical A Surgical History Notes nd Psychiatric bi polar , PTSD [place], [place] ([id number]) [id number] Page 5 of 7 Objective Constitutional sitting or standing blood pressure is within target range for patient.. pulse regular and within target range for patient.Marland Kitchen respirations regular, non-labored and within target range for patient.Marland Kitchen temperature within target range for patient.. Well-nourished and well-hydrated in no acute distress. Vitals Time Taken: 11:24 AM, Height: 64 in, Source: Stated, Weight: 160 lbs, Source: Stated, BMI: 27.5, Temperature: 98.5 F, Pulse: 75 bpm, Respiratory Rate: 18 breaths/min, Blood Pressure: 130/82 mmHg. Eyes conjunctiva clear no eyelid edema noted. pupils equal round and reactive to light and accommodation. Ears, Nose, Mouth, and Throat no gross abnormality of ear auricles or external auditory canals. normal hearing noted during conversation. mucus membranes moist. Respiratory normal breathing without difficulty. Cardiovascular 2+ dorsalis pedis/posterior tibialis pulses. no clubbing, cyanosis, significant edema, Musculoskeletal normal gait and posture. no significant deformity or arthritic changes, no loss or range of motion, no clubbing. Psychiatric this patient is able to make decisions and demonstrates good insight into disease process. Alert and Oriented x 3. pleasant and cooperative. General Notes: Upon inspection patient's wound area of which I did thoroughly evaluate today does not show any signs of an opening right now there also does not appear to be any  evidence of active infection at this point. All this is good news. With that being said I did recommend based on what I am seeing that we have her continue to monitor for any signs of infection or worsening. Assessment Active Problems ICD-10 Other specified disorders of the skin and subcutaneous tissue Essential (primary) hypertension Alcohol dependence with unspecified alcohol-induced disorder Bipolar disorder, current episode depressed, moderate Plan Discharge From [facility]: Consult Only  1. Based on what I am seeing I do believe that the patient would benefit from close monitoring of this area. I discussed that with her today. Obviously right now she is doing extremely well. My suggestion would be that she use some AandD ointment over the area and not between the toes but on the top of her foot to keep the skin integrity intact. 2. Also recommended that she watch out and let us know as soon as possible if she develops any erythema or redness that would indicate infection. My suggestion would be if she is not able to get in here in a timely manner due to being considered a new patient that she should go to urgent care as soon as possible to see about an x-ray and a culture of the site while it is actually open and draining. That we will have an idea of what is going on and if there is anything actively problematic. This would also give [place] an ability to be able to look for any signs or issues that could warrant an MRI of the area if it continues to be a recurrent problem. 3. I am also can recommend that she probably should be wearing some kind of socks in order to avoid anything rubbing on the top of her foot. She currently had shoes on with no socks. She voiced understanding and she is going to look into that as well. Will see her back for follow-up visit as needed. Electronic Signature(s) Signed: 10/22/2022 2:14:26 PM By: Worthy Keeler PA-C Entered By: Worthy Keeler on 10/22/2022  14:14:26 [place], [place] ([id number]) [id number] Page 6 of 7 -------------------------------------------------------------------------------- ROS/PFSH Details Patient Name: Date of Service: Rachael Mosley [facility] 10/22/2022 11:30 A M Medical Record Number: [id number] Patient Account Number: [id number] Date of Birth/Sex: Treating RN: 07-Mar-1966 (57 y.o. Orvan Falconer Primary Care Provider: Karl Ito Other Clinician: Referring Provider: Treating Provider/Extender: Elmer Ramp in Treatment: 0 Cardiovascular Medical History: Positive for: Hypertension Psychiatric Medical History: Past Medical History Notes: bi polar , PTSD Immunizations Pneumococcal Vaccine: Received Pneumococcal Vaccination: Yes Received Pneumococcal Vaccination On or After 60th Birthday: No Implantable Devices None Family and Social History Former smoker; Marital Status - Married; Alcohol Use: Rarely; Drug Use: Current History - weed; Caffeine Use: Daily Electronic Signature(s) Signed: 10/22/2022 11:51:39 AM By: Carlene Coria RN Signed: 10/22/2022 2:41:09 PM By: Worthy Keeler PA-C Entered By: Carlene Coria on 10/22/2022 11:25:42 -------------------------------------------------------------------------------- SuperBill Details Patient Name: Date of Service: Rachael Mosley, [place] 10/22/2022 Medical Record Number: [id number] Patient Account Number: [id number] Date of Birth/Sex: Treating RN: July 02, 1966 (57 y.o. Orvan Falconer Primary Care Provider: Karl Ito Other Clinician: Referring Provider: Treating Provider/Extender: Elmer Ramp in Treatment: 0 Diagnosis Coding [place], [place] ([id number]) [id number] Page 7 of 7 ICD-10 Codes Code Description L98.8 Other specified disorders of the skin and subcutaneous tissue I10 Essential (primary) hypertension F10.29 Alcohol dependence with unspecified alcohol-induced  disorder F31.32 Bipolar disorder, current episode depressed, moderate Facility Procedures : CPT4 Code: 63016010 Description: 93235 - WOUND CARE VISIT-LEV 2 EST PT Modifier: Quantity: 1 Physician Procedures : CPT4 Code Description Modifier [id number] - WC PHYS LEVEL 3 - EST PT ICD-10 Diagnosis Description L98.8 Other specified disorders of the skin and subcutaneous tissue I10 Essential (primary) hypertension F10.29 Alcohol dependence with unspecified  alcohol-induced disorder F31.32 Bipolar disorder, current episode depressed, moderate Quantity: 1 Electronic Signature(s) Signed: 10/22/2022 2:14:43 PM By: Worthy Keeler PA-C Previous Signature: 10/22/2022 11:51:39 AM Version  By: Carlene Coria RN Entered By: Worthy Keeler on 10/22/2022 14:14:43

## 2022-10-28 ENCOUNTER — Ambulatory Visit (INDEPENDENT_AMBULATORY_CARE_PROVIDER_SITE_OTHER): Payer: Medicare Other | Admitting: Dermatology

## 2022-10-28 VITALS — BP 119/77 | HR 80

## 2022-10-28 DIAGNOSIS — Z79899 Other long term (current) drug therapy: Secondary | ICD-10-CM

## 2022-10-28 DIAGNOSIS — Z1283 Encounter for screening for malignant neoplasm of skin: Secondary | ICD-10-CM

## 2022-10-28 DIAGNOSIS — L814 Other melanin hyperpigmentation: Secondary | ICD-10-CM

## 2022-10-28 DIAGNOSIS — L281 Prurigo nodularis: Secondary | ICD-10-CM | POA: Diagnosis not present

## 2022-10-28 DIAGNOSIS — L821 Other seborrheic keratosis: Secondary | ICD-10-CM

## 2022-10-28 DIAGNOSIS — R202 Paresthesia of skin: Secondary | ICD-10-CM

## 2022-10-28 DIAGNOSIS — D229 Melanocytic nevi, unspecified: Secondary | ICD-10-CM

## 2022-10-28 DIAGNOSIS — L578 Other skin changes due to chronic exposure to nonionizing radiation: Secondary | ICD-10-CM

## 2022-10-28 DIAGNOSIS — D1801 Hemangioma of skin and subcutaneous tissue: Secondary | ICD-10-CM

## 2022-10-28 DIAGNOSIS — Z7189 Other specified counseling: Secondary | ICD-10-CM

## 2022-10-28 MED ORDER — MOMETASONE FUROATE 0.1 % EX CREA: TOPICAL_CREAM | CUTANEOUS | 3 refills | Status: DC

## 2022-10-28 NOTE — Progress Notes (Signed)
New Patient Visit  Subjective  Rachael Mosley is a 57 y.o. female who presents for the following: Annual Exam. The patient presents for Total-Body Skin Exam (TBSE) for skin cancer screening and mole check.  The patient has spots, moles and lesions to be evaluated, some may be new or changing and the patient has concerns that these could be cancer.   The following portions of the chart were reviewed this encounter and updated as appropriate:   Tobacco  Allergies  Meds  Problems  Med Hx  Surg Hx  Fam Hx     Review of Systems:  No other skin or systemic complaints except as noted in HPI or Assessment and Plan.  Objective  Well appearing patient in no apparent distress; mood and affect are within normal limits.  A full examination was performed including scalp, head, eyes, ears, nose, lips, neck, chest, axillae, abdomen, back, buttocks, bilateral upper extremities, bilateral lower extremities, hands, feet, fingers, toes, fingernails, and toenails. All findings within normal limits unless otherwise noted below.  back Normal appearing skin   Right Antecubital Fossa Thicken dry patch    Assessment & Plan  Notalgia paresthetica back Notalgia paresthetica is a chronic condition affecting the skin of the back in which a pinched nerve along the spine causes itching or changes in sensation in an area of skin. This is usually accompanied by chronic rubbing or scratching often leaving the area of skin discolored and thickened. There is no cure, but there are some treatments which may help control the itch.   Over the counter (non-prescription) treatments for notalgia paresthetica include numbing creams like pramoxine or lidocaine which temporarily reduce itch or Capsaicin-containing creams which cause a burning sensation but which sometimes over time will reset the nerves to stop producing itch.  If you choose to use Capsaicin cream, it is recommended to use it 5 times daily for 1 week  followed by 3 times daily for 3-6 weeks. You may have to continue using it long-term. - If not doing well with OTC options, could consider [employer] compounded prescription anti-itch cream with Amitriptyline 5% / Lidocaine 5% / Pramoxine 1% or Amitriptyline 5% / Gabapentin 10% / Lidocaine 5% Cream. For severe cases, there are some prescription cream or pill options which may help. 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 Condition is symptomatic / bothersome to patient. Not to goal.  Keep fingers off/avoid picking   Start Mometasone cream apply to affected skin 5 night a week   Topical steroids (such as triamcinolone, fluocinolone, fluocinonide, mometasone, clobetasol, halobetasol, betamethasone, hydrocortisone) can cause thinning and lightening of the skin if they are used for too long in the same area. Your physician has selected the right strength medicine for your problem and area affected on the body. Please use your medication only as directed by your physician to prevent side effects.    Related Medications mometasone (ELOCON) 0.1 % cream Apply to affected skin 5 nights a weej  Lentigines - Scattered tan macules - Due to sun  exposure - Benign-appearing, observe - Recommend daily broad spectrum sunscreen SPF 30+ to sun-exposed areas, reapply every 2 hours as needed. - Call for any changes  Seborrheic Keratoses - Stuck-on, waxy, tan-brown papules and/or plaques  -  Benign-appearing - Discussed benign etiology and prognosis. - Observe - Call for any changes  Melanocytic Nevi - Tan-brown and/or pink-flesh-colored symmetric macules and papules - Benign appearing on exam today - Observation - Call clinic for new or changing moles - Recommend daily use of broad spectrum spf 30+ sunscreen to sun-exposed areas.   Hemangiomas - Red papules - Discussed benign nature - Observe - Call for any changes  Actinic Damage - Chronic condition, secondary to cumulative UV/sun exposure - diffuse scaly erythematous macules with underlying dyspigmentation - Recommend daily broad spectrum sunscreen SPF 30+ to sun-exposed areas, reapply every 2 hours as needed.  - Staying in the shade or wearing long sleeves, sun glasses (UVA+UVB protection) and wide brim hats (4-inch brim around the entire circumference of the hat) are also recommended for sun protection.  - Call for new or changing lesions.  Skin cancer screening performed today.   Return in about 3 months (around 01/26/2023) for Notalgia paresthetica .  IMarye Round, CMA, am acting as scribe for Sarina Ser, MD .  Documentation: I have reviewed the above documentation for accuracy and completeness, and I agree with the above.  Sarina Ser, MD

## 2022-10-28 NOTE — Patient Instructions (Signed)
Due to recent changes in healthcare laws, you may see results of your pathology and/or laboratory studies on MyChart before the doctors have had a chance to review them. We understand that in some cases there may be results that are confusing or concerning to you. Please understand that not all results are received at the same time and often the doctors may need to interpret multiple results in order to provide you with the best plan of care or course of treatment. Therefore, we ask that you please give us 2 business days to thoroughly review all your results before contacting the office for clarification. Should we see a critical lab result, you will be contacted sooner.   If You Need Anything After Your Visit  If you have any questions or concerns for your doctor, please call our main line at [phone] and press option 4 to reach your doctor's medical assistant. If no one answers, please leave a voicemail as directed and we will return your call as soon as possible. Messages left after 4 pm will be answered the following business day.   You may also send us a message via MyChart. We typically respond to MyChart messages within 1-2 business days.  For prescription refills, please ask your pharmacy to contact our office. Our fax number is [phone].  If you have an urgent issue when the clinic is closed that cannot wait until the next business day, you can page your doctor at the number below.    Please note that while we do our best to be available for urgent issues outside of office hours, we are not available 24/7.   If you have an urgent issue and are unable to reach us, you may choose to seek medical care at your doctor's office, retail clinic, urgent care center, or emergency room.  If you have a medical emergency, please immediately call 911 or go to the emergency department.  Pager Numbers  - Dr. Kowalski: [phone]  - Dr. Moye: [phone]  - Dr. Stewart:  [phone]  In the event of inclement weather, please call our main line at [phone] for an update on the status of any delays or closures.  Dermatology Medication Tips: Please keep the boxes that topical medications come in in order to help keep track of the instructions about where and how to use these. Pharmacies typically print the medication instructions only on the boxes and not directly on the medication tubes.   If your medication is too expensive, please contact our office at [phone] option 4 or send us a message through MyChart.   We are unable to tell what your co-pay for medications will be in advance as this is different depending on your insurance coverage. However, we may be able to find a substitute medication at lower cost or fill out paperwork to get insurance to cover a needed medication.   If a prior authorization is required to get your medication covered by your insurance company, please allow us 1-2 business days to complete this process.  Drug prices often vary depending on where the prescription is filled and some pharmacies may offer cheaper prices.  The website www.goodrx.com contains coupons for medications through different pharmacies. The prices here do not account for what the cost may be with help from insurance (it may be cheaper with your insurance), but the website can give you the price if you did not use any insurance.  - You can print the associated coupon and take it with   your prescription to the pharmacy.  - You may also stop by our office during regular business hours and pick up a GoodRx coupon card.  - If you need your prescription sent electronically to a different pharmacy, notify our office through [facility] MyChart or by phone at [phone] option 4.     Si Usted Necesita Algo Despus de Su Visita  Tambin puede enviarnos un mensaje a travs de MyChart. Por lo general respondemos a los mensajes de MyChart en el transcurso de 1 a 2  das hbiles.  Para renovar recetas, por favor pida a su farmacia que se ponga en contacto con nuestra oficina. Nuestro nmero de fax es el [phone].  Si tiene un asunto urgente cuando la clnica est cerrada y que no puede esperar hasta el siguiente da hbil, puede llamar/localizar a su doctor(a) al nmero que aparece a continuacin.   Por favor, tenga en cuenta que aunque hacemos todo lo posible para estar disponibles para asuntos urgentes fuera del horario de oficina, no estamos disponibles las 24 horas del da, los 7 das de la semana.   Si tiene un problema urgente y no puede comunicarse con nosotros, puede optar por buscar atencin mdica  en el consultorio de su doctor(a), en una clnica privada, en un centro de atencin urgente o en una sala de emergencias.  Si tiene una emergencia mdica, por favor llame inmediatamente al 911 o vaya a la sala de emergencias.  Nmeros de bper  - Dr. Kowalski: [phone]  - Dra. Moye: [phone]  - Dra. Stewart: [phone]  En caso de inclemencias del tiempo, por favor llame a nuestra lnea principal al [phone] para una actualizacin sobre el estado de cualquier retraso o cierre.  Consejos para la medicacin en dermatologa: Por favor, guarde las cajas en las que vienen los medicamentos de uso tpico para ayudarle a seguir las instrucciones sobre dnde y cmo usarlos. Las farmacias generalmente imprimen las instrucciones del medicamento slo en las cajas y no directamente en los tubos del medicamento.   Si su medicamento es muy caro, por favor, pngase en contacto con nuestra oficina llamando al [phone] y presione la opcin 4 o envenos un mensaje a travs de MyChart.   No podemos decirle cul ser su copago por los medicamentos por adelantado ya que esto es diferente dependiendo de la cobertura de su seguro. Sin embargo, es posible que podamos encontrar un medicamento sustituto a menor costo o llenar un formulario para que el  seguro cubra el medicamento que se considera necesario.   Si se requiere una autorizacin previa para que su compaa de seguros cubra su medicamento, por favor permtanos de 1 a 2 das hbiles para completar este proceso.  Los precios de los medicamentos varan con frecuencia dependiendo del lugar de dnde se surte la receta y alguna farmacias pueden ofrecer precios ms baratos.  El sitio web www.goodrx.com tiene cupones para medicamentos de diferentes farmacias. Los precios aqu no tienen en cuenta lo que podra costar con la ayuda del seguro (puede ser ms barato con su seguro), pero el sitio web puede darle el precio si no utiliz ningn seguro.  - Puede imprimir el cupn correspondiente y llevarlo con su receta a la farmacia.  - Tambin puede pasar por nuestra oficina durante el horario de atencin regular y recoger una tarjeta de cupones de GoodRx.  - Si necesita que su receta se enve electrnicamente a una farmacia diferente, informe a nuestra oficina a travs de MyChart de [facility]   o por telfono llamando al [phone] y presione la opcin 4.  

## 2022-11-06 ENCOUNTER — Other Ambulatory Visit: Payer: Self-pay | Admitting: Obstetrics and Gynecology

## 2022-11-06 DIAGNOSIS — N941 Unspecified dyspareunia: Secondary | ICD-10-CM

## 2022-11-09 ENCOUNTER — Encounter: Payer: Self-pay | Admitting: Dermatology

## 2022-11-10 ENCOUNTER — Encounter: Payer: Self-pay | Admitting: Dermatology

## 2022-11-11 ENCOUNTER — Encounter: Payer: Self-pay | Admitting: Dermatology

## 2022-11-11 ENCOUNTER — Ambulatory Visit (INDEPENDENT_AMBULATORY_CARE_PROVIDER_SITE_OTHER): Payer: Medicare Other | Admitting: Dermatology

## 2022-11-11 VITALS — BP 98/70 | HR 84

## 2022-11-11 DIAGNOSIS — R21 Rash and other nonspecific skin eruption: Secondary | ICD-10-CM

## 2022-11-11 DIAGNOSIS — T07XXXA Unspecified multiple injuries, initial encounter: Secondary | ICD-10-CM

## 2022-11-11 NOTE — Progress Notes (Signed)
Follow-Up Visit   Subjective  Rachael Mosley is a 57 y.o. 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  The following portions of the chart were reviewed this encounter and updated as appropriate:   Tobacco  Allergies  Meds  Problems  Med Hx  Surg Hx  Fam Hx     Review of Systems:  No other skin or systemic complaints except as noted in HPI or Assessment and Plan.  Objective  Well appearing patient in no apparent distress; mood is irritated mood and confrontational.  Pt contradicts previous visit documentation and disputes my objective examination findings today.  A focused examination was performed including face, back. Relevant physical exam findings are noted in the Assessment and Plan.  back, face Excoriations and crust in a linear pattern of the upper back and shoulders, similar crust and linear excoriations face. No primary rash lesions today on face or back or shoulders today.  Assessment & Plan  Rash = Multiple Linear crusts and excoriations of upper back and shoulders and similar but fewer of face today. back, face (See photo sent by pt in myChart communication 11/10/2022)) Pt was in irritated mood as I entered her exam room today.  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 I advised her that these exam findings were very specific comments entered at the time of her exam last visit by the scribe/MA and my objective expert observation and documented, so low likelihood it would be in error.  Nevertheless, I diverted our conversation to what she was concerned about today and she said she has a rash on her back and face and the Mometasone cream she was given for some occasional symptoms on her back last visit that we determined to likely be Notalgia Paresthetica (an itchy condition that often has no objective signs of "rash") was not working on this current face and back rash and she discontinued it.  I asked to see her rash and she showed me her back showing multiple linear crusts and excoriations of the upper back and shoulders.  There did not appear to be any "primary lesions" today.  I commented to her that it appeared that she was scratching her back.  She immediately became annoyed and irritated and she denied she was scratching her back and stated that it was a rash that did not itch. I advised her that objectively she exhibited linear crusts and excoriations without primary rash lesions and that was a sign of scratching.  She became more annoyed and exasperated making some further denial comments and indicated she had had enough and started taking off her gown to prepare to  leave. I commented that I would discuss her objective findings and treatment options with her if she was willing to listen to my expert opinion.  She decided to leave the office.  Return if symptoms worsen or fail to improve.  I, Othelia Pulling, RMA, am acting as scribe for Sarina Ser, MD . Documentation: I have reviewed the above documentation for accuracy and completeness, and I agree with the above.  Sarina Ser, MD

## 2022-11-11 NOTE — Patient Instructions (Signed)
Due to recent changes in healthcare laws, you may see results of your pathology and/or laboratory studies on MyChart before the doctors have had a chance to review them. We understand that in some cases there may be results that are confusing or concerning to you. Please understand that not all results are received at the same time and often the doctors may need to interpret multiple results in order to provide you with the best plan of care or course of treatment. Therefore, we ask that you please give us 2 business days to thoroughly review all your results before contacting the office for clarification. Should we see a critical lab result, you will be contacted sooner.   If You Need Anything After Your Visit  If you have any questions or concerns for your doctor, please call our main line at [phone] and press option 4 to reach your doctor's medical assistant. If no one answers, please leave a voicemail as directed and we will return your call as soon as possible. Messages left after 4 pm will be answered the following business day.   You may also send us a message via MyChart. We typically respond to MyChart messages within 1-2 business days.  For prescription refills, please ask your pharmacy to contact our office. Our fax number is [phone].  If you have an urgent issue when the clinic is closed that cannot wait until the next business day, you can page your doctor at the number below.    Please note that while we do our best to be available for urgent issues outside of office hours, we are not available 24/7.   If you have an urgent issue and are unable to reach us, you may choose to seek medical care at your doctor's office, retail clinic, urgent care center, or emergency room.  If you have a medical emergency, please immediately call 911 or go to the emergency department.  Pager Numbers  - Dr. Kowalski: [phone]  - Dr. Moye: [phone]  - Dr. Stewart:  [phone]  In the event of inclement weather, please call our main line at [phone] for an update on the status of any delays or closures.  Dermatology Medication Tips: Please keep the boxes that topical medications come in in order to help keep track of the instructions about where and how to use these. Pharmacies typically print the medication instructions only on the boxes and not directly on the medication tubes.   If your medication is too expensive, please contact our office at [phone] option 4 or send us a message through MyChart.   We are unable to tell what your co-pay for medications will be in advance as this is different depending on your insurance coverage. However, we may be able to find a substitute medication at lower cost or fill out paperwork to get insurance to cover a needed medication.   If a prior authorization is required to get your medication covered by your insurance company, please allow us 1-2 business days to complete this process.  Drug prices often vary depending on where the prescription is filled and some pharmacies may offer cheaper prices.  The website www.goodrx.com contains coupons for medications through different pharmacies. The prices here do not account for what the cost may be with help from insurance (it may be cheaper with your insurance), but the website can give you the price if you did not use any insurance.  - You can print the associated coupon and take it with   your prescription to the pharmacy.  - You may also stop by our office during regular business hours and pick up a GoodRx coupon card.  - If you need your prescription sent electronically to a different pharmacy, notify our office through [facility] MyChart or by phone at [phone] option 4.     Si Usted Necesita Algo Despus de Su Visita  Tambin puede enviarnos un mensaje a travs de MyChart. Por lo general respondemos a los mensajes de MyChart en el transcurso de 1 a 2  das hbiles.  Para renovar recetas, por favor pida a su farmacia que se ponga en contacto con nuestra oficina. Nuestro nmero de fax es el [phone].  Si tiene un asunto urgente cuando la clnica est cerrada y que no puede esperar hasta el siguiente da hbil, puede llamar/localizar a su doctor(a) al nmero que aparece a continuacin.   Por favor, tenga en cuenta que aunque hacemos todo lo posible para estar disponibles para asuntos urgentes fuera del horario de oficina, no estamos disponibles las 24 horas del da, los 7 das de la semana.   Si tiene un problema urgente y no puede comunicarse con nosotros, puede optar por buscar atencin mdica  en el consultorio de su doctor(a), en una clnica privada, en un centro de atencin urgente o en una sala de emergencias.  Si tiene una emergencia mdica, por favor llame inmediatamente al 911 o vaya a la sala de emergencias.  Nmeros de bper  - Dr. Kowalski: [phone]  - Dra. Moye: [phone]  - Dra. Stewart: [phone]  En caso de inclemencias del tiempo, por favor llame a nuestra lnea principal al [phone] para una actualizacin sobre el estado de cualquier retraso o cierre.  Consejos para la medicacin en dermatologa: Por favor, guarde las cajas en las que vienen los medicamentos de uso tpico para ayudarle a seguir las instrucciones sobre dnde y cmo usarlos. Las farmacias generalmente imprimen las instrucciones del medicamento slo en las cajas y no directamente en los tubos del medicamento.   Si su medicamento es muy caro, por favor, pngase en contacto con nuestra oficina llamando al [phone] y presione la opcin 4 o envenos un mensaje a travs de MyChart.   No podemos decirle cul ser su copago por los medicamentos por adelantado ya que esto es diferente dependiendo de la cobertura de su seguro. Sin embargo, es posible que podamos encontrar un medicamento sustituto a menor costo o llenar un formulario para que el  seguro cubra el medicamento que se considera necesario.   Si se requiere una autorizacin previa para que su compaa de seguros cubra su medicamento, por favor permtanos de 1 a 2 das hbiles para completar este proceso.  Los precios de los medicamentos varan con frecuencia dependiendo del lugar de dnde se surte la receta y alguna farmacias pueden ofrecer precios ms baratos.  El sitio web www.goodrx.com tiene cupones para medicamentos de diferentes farmacias. Los precios aqu no tienen en cuenta lo que podra costar con la ayuda del seguro (puede ser ms barato con su seguro), pero el sitio web puede darle el precio si no utiliz ningn seguro.  - Puede imprimir el cupn correspondiente y llevarlo con su receta a la farmacia.  - Tambin puede pasar por nuestra oficina durante el horario de atencin regular y recoger una tarjeta de cupones de GoodRx.  - Si necesita que su receta se enve electrnicamente a una farmacia diferente, informe a nuestra oficina a travs de MyChart de [facility]   o por telfono llamando al [phone] y presione la opcin 4.  

## 2022-11-12 NOTE — Progress Notes (Unsigned)
Chief Complaint:   OBESITY Rachael Mosley is here to discuss her progress with her obesity treatment plan along with follow-up of her obesity related diagnoses. Rachael Mosley is on the [employer] and states she is following her eating plan approximately 90% of the time. Rachael Mosley states she is doing weights and exercise 20-30 minutes 5 times per week.  Today's visit was #: 8 Starting weight: 188 lbs Starting date: 04/20/2022 Today's weight: 161 lbs Today's date: 10/21/2022 Total lbs lost to date: 27 Total lbs lost since last in-office visit: 2  Interim History: Rachael Mosley is consistent with working out. She didn't eat much while having GI upset on short-acting Metformin. This has improved with Metformin XR. Her husband does bring her junk food. She is happy with her progress and cravings are improving.  Subjective:   1. Pre-diabetes Changed Metformin back to XR 1,000 mg BID with food Pt had nausea and vomiting with Metformin immediate release. Last A1c 5.9 on 08/10/22 She reduced sugar intake but is on Abilify.  2. Vitamin D deficiency Rachael Mosley is taking OTC Vitamin D 6,000 IU daily. Last Vitamin D level 33.2  3. Elevated triglycerides with high cholesterol Via is taking atorvastatin 20 mg daily. Triglycerides 313 on 03/11/22 She denies myalgias. She is actively working on prescribed diet, regular exercise, and weight loss.  Assessment/Plan:   1. Pre-diabetes Continue to limit sugar intake. Repeat A1c and fasting insulin in 2 months. Reduce Metformin if improving.  Refill- metFORMIN (GLUCOPHAGE-XR) 500 MG 24 hr tablet; Take 2 tablets (1,000 mg total) by mouth 2 (two) times daily with a meal.  Dispense: 120 tablet; Refill: 0  2. Vitamin D deficiency Recheck Vitamin D level in 2 months.  3. Elevated triglycerides with high cholesterol Recheck fasting lipid panel in 2 months.  4. Generalized obesity 5. BMI 27.0-27.9,adult Reviewed overall progress and will plan for maintenance  phase in the next 1-3 months.  Rachael Mosley is currently in the action stage of change. As such, her goal is to continue with weight loss efforts. She has agreed to the [employer].   Exercise goals:  As is  Behavioral modification strategies: increasing lean protein intake, increasing vegetables, increasing water intake, decreasing eating out, no skipping meals, meal planning and cooking strategies, keeping healthy foods in the home, avoiding temptations, and planning for success.  Rachael Mosley has agreed to follow-up with our clinic in 4 weeks. She was informed of the importance of frequent follow-up visits to maximize her success with intensive lifestyle modifications for her multiple health conditions.   Objective:   Blood pressure 114/77, pulse 63, temperature 99.1 F (37.3 C), height '5\' 4"'$  (1.626 m), weight 161 lb (73 kg), last menstrual period 05/21/2020, SpO2 98 %. Body mass index is 27.64 kg/m.  General: Cooperative, alert, well developed, in no acute distress. HEENT: Conjunctivae and lids unremarkable. Cardiovascular: Regular rhythm.  Lungs: Normal work of breathing. Neurologic: No focal deficits.   Lab Results  Component Value Date   CREATININE 0.51 03/11/2022   BUN 11 03/11/2022   NA 140 03/11/2022   K 4.3 03/11/2022   CL 105 03/11/2022   CO2 26 03/11/2022   Lab Results  Component Value Date   ALT 12 03/11/2022   AST 14 03/11/2022   ALKPHOS 91 03/11/2022   BILITOT 0.2 03/11/2022   Lab Results  Component Value Date   HGBA1C 5.9 (H) 08/10/2022   HGBA1C 6.2 03/11/2022   No results found for: "INSULIN" Lab Results  Component Value Date  TSH 1.22 03/11/2022   Lab Results  Component Value Date   CHOL 151 03/11/2022   HDL 41.10 03/11/2022   LDLDIRECT 73.0 03/11/2022   TRIG 313.0 (H) 03/11/2022   CHOLHDL 4 03/11/2022   Lab Results  Component Value Date   VD25OH 33.2 04/20/2022   Lab Results  Component Value Date   WBC 15.0 (H) 03/11/2022   HGB 14.6  03/11/2022   HCT 45.0 03/11/2022   MCV 89.3 03/11/2022   PLT 269.0 03/11/2022   Attestation Statements:   Reviewed by clinician on day of visit: allergies, medications, problem list, medical history, surgical history, family history, social history, and previous encounter notes.  I, Kathlene November, BS, CMA, am acting as transcriptionist for Loyal Gambler, DO.   I have reviewed the above documentation for accuracy and completeness, and I agree with the above. Dell Ponto, DO

## 2022-11-15 ENCOUNTER — Ambulatory Visit (INDEPENDENT_AMBULATORY_CARE_PROVIDER_SITE_OTHER): Payer: Medicare Other | Admitting: Nurse Practitioner

## 2022-11-15 ENCOUNTER — Encounter: Payer: Self-pay | Admitting: Nurse Practitioner

## 2022-11-15 VITALS — BP 98/60 | HR 77 | Temp 98.2°F | Resp 16 | Ht 64.0 in | Wt 164.1 lb

## 2022-11-15 DIAGNOSIS — L989 Disorder of the skin and subcutaneous tissue, unspecified: Secondary | ICD-10-CM | POA: Diagnosis not present

## 2022-11-15 DIAGNOSIS — I10 Essential (primary) hypertension: Secondary | ICD-10-CM

## 2022-11-15 NOTE — Patient Instructions (Signed)
Nice to see you today We will discontinue the amlodipine.  Keep up the good work

## 2022-11-15 NOTE — Assessment & Plan Note (Signed)
Patient was followed by dermatologist would like a second opinion.  Ambulatory referral placed today

## 2022-11-15 NOTE — Progress Notes (Signed)
   Established Patient Office Visit  Subjective   Patient ID: Rachael Mosley, female    DOB: Apr 29, 1966  Age: 57 y.o. MRN: [id number]  Chief Complaint  Patient presents with   Medication Consultation    Stop amlodipine 1 month ago    HPI  HTN: Patient messaged me on 10/12/2022 and states that her dentist that her amlodipine is doing something to her gums.  Patient states she has stopped smoking and is working with healthy weight and wellness center blood pressures been doing really well and whether she can come off the medication.  Patient was advised via MyChart that she come off the medication but needed to follow-up in office in 1 month for blood pressure recheck.  Patient is here for that today.  States that she saw her dentist had mentioned that it was doing something to the gyms. She ha da low reading. No symptoms    Review of Systems  Constitutional:  Negative for chills and fever.  Respiratory:  Negative for shortness of breath.   Cardiovascular:  Negative for chest pain.  Neurological:  Negative for dizziness and headaches.      Objective:     BP 98/60   Pulse 77   Temp 98.2 F (36.8 C)   Resp 16   Ht 5' 4"$  (1.626 m)   Wt 164 lb 2 oz (74.4 kg)   LMP 05/21/2020 (Approximate)   SpO2 94%   BMI 28.17 kg/m    Physical Exam Vitals and nursing note reviewed.  Constitutional:      Appearance: Normal appearance.  Cardiovascular:     Rate and Rhythm: Normal rate and regular rhythm.     Heart sounds: Normal heart sounds.  Pulmonary:     Effort: Pulmonary effort is normal.     Breath sounds: Normal breath sounds.  Neurological:     Mental Status: She is alert.      No results found for any visits on 11/15/22.    The 10-year ASCVD risk score (Arnett DK, et al., 2019) is: 1.1%    Assessment & Plan:   Problem List Items Addressed This Visit       Cardiovascular and Mediastinum   Essential hypertension    Patient's blood pressure well-controlled.  Will  discontinue amlodipine at this current juncture continue working with healthy weight and wellness to manage weight        Musculoskeletal and Integument   Skin lesion - Primary    Patient was followed by dermatologist would like a second opinion.  Ambulatory referral placed today      Relevant Orders   Ambulatory referral to Dermatology    Return if symptoms worsen or fail to improve, for as scheduled .    Romilda Garret, NP

## 2022-11-15 NOTE — Assessment & Plan Note (Signed)
Patient's blood pressure well-controlled.  Will discontinue amlodipine at this current juncture continue working with healthy weight and wellness to manage weight

## 2022-11-17 ENCOUNTER — Encounter: Payer: Self-pay | Admitting: *Deleted

## 2022-11-18 ENCOUNTER — Ambulatory Visit (INDEPENDENT_AMBULATORY_CARE_PROVIDER_SITE_OTHER): Payer: Medicare Other | Admitting: Family Medicine

## 2022-11-22 ENCOUNTER — Encounter (INDEPENDENT_AMBULATORY_CARE_PROVIDER_SITE_OTHER): Payer: Self-pay | Admitting: Family Medicine

## 2022-11-22 ENCOUNTER — Ambulatory Visit (INDEPENDENT_AMBULATORY_CARE_PROVIDER_SITE_OTHER): Payer: Medicare Other | Admitting: Family Medicine

## 2022-11-22 VITALS — BP 115/78 | HR 84 | Temp 98.7°F | Ht 64.0 in | Wt 159.0 lb

## 2022-11-22 DIAGNOSIS — Z6827 Body mass index (BMI) 27.0-27.9, adult: Secondary | ICD-10-CM

## 2022-11-22 DIAGNOSIS — R7303 Prediabetes: Secondary | ICD-10-CM | POA: Diagnosis not present

## 2022-11-22 DIAGNOSIS — E669 Obesity, unspecified: Secondary | ICD-10-CM | POA: Diagnosis not present

## 2022-11-22 DIAGNOSIS — E782 Mixed hyperlipidemia: Secondary | ICD-10-CM

## 2022-11-22 DIAGNOSIS — E559 Vitamin D deficiency, unspecified: Secondary | ICD-10-CM

## 2022-11-22 NOTE — Assessment & Plan Note (Signed)
Last vitamin D Lab Results  Component Value Date   VD25OH 33.2 04/20/2022   Patient has been taking OTC vitamin D 6000 IU once daily.  We reviewed a target goal vitamin D 50-70.  Plan to recheck vitamin D level next visit.

## 2022-11-22 NOTE — Progress Notes (Signed)
Office: [phone]  /  Fax: [phone]  WEIGHT SUMMARY AND BIOMETRICS  Vitals Temp: 98.7 F (37.1 C) BP: 115/78 Pulse Rate: 84 SpO2: 97 %   Anthropometric Measurements Height: '5\' 4"'$  (1.626 m) Weight: 159 lb (72.1 kg) BMI (Calculated): 27.28 Weight at Last Visit: 161lb Weight Lost Since Last Visit: 2lb Starting Weight: 188lb Total Weight Loss (lbs): 29 lb (13.2 kg)   Body Composition  Body Fat %: 38.3 % Fat Mass (lbs): 61.2 lbs Muscle Mass (lbs): 93.4 lbs Total Body Water (lbs): 71 lbs Visceral Fat Rating : 8   Other Clinical Data Fasting: no Labs: no Today's Visit #: 9 Starting Date: 04/20/22    HPI  Chief Complaint: OBESITY  Rachael Mosley is here to discuss her progress with her obesity treatment plan. She is on the the [facility] and states she is following her eating plan approximately 75 % of the time. She states she is exercising 60 minutes 6 times per week.   Interval History:  Since last office visit she is down 2 lb She is still tempted by junk food that her husband brings home Tolerating metformin XR 1000 mg bid for prediabetes Body fat down 1.2 pounds in the past month Total progress: Down 29 pounds and 7 months of medically supervised weight management.  this is a 15% total body weight loss. She has started using home exercise equipment introducing some resistance training.  Pharmacotherapy: None  PHYSICAL EXAM:  Blood pressure 115/78, pulse 84, temperature 98.7 F (37.1 C), height '5\' 4"'$  (1.626 m), weight 159 lb (72.1 kg), last menstrual period 05/21/2020, SpO2 97 %. Body mass index is 27.29 kg/m.  General: She is overweight, cooperative, alert, well developed, and in no acute distress. PSYCH: Has normal mood, affect and thought process.   Lungs: Normal breathing effort, no conversational dyspnea.  DIAGNOSTIC DATA REVIEWED:  BMET    Component Value Date/Time   NA 140 03/11/2022 0832   K 4.3 03/11/2022 0832   CL 105 03/11/2022  0832   CO2 26 03/11/2022 0832   GLUCOSE 99 03/11/2022 0832   BUN 11 03/11/2022 0832   CREATININE 0.51 03/11/2022 0832   CALCIUM 9.5 03/11/2022 0832   Lab Results  Component Value Date   HGBA1C 5.9 (H) 08/10/2022   HGBA1C 6.2 03/11/2022   No results found for: "INSULIN" Lab Results  Component Value Date   TSH 1.22 03/11/2022   CBC    Component Value Date/Time   WBC 15.0 (H) 03/11/2022 0832   RBC 5.04 03/11/2022 0832   HGB 14.6 03/11/2022 0832   HCT 45.0 03/11/2022 0832   PLT 269.0 03/11/2022 0832   MCV 89.3 03/11/2022 0832   MCHC 32.5 03/11/2022 0832   RDW 15.9 (H) 03/11/2022 0832   Iron Studies No results found for: "IRON", "TIBC", "FERRITIN", "IRONPCTSAT" Lipid Panel     Component Value Date/Time   CHOL 151 03/11/2022 0832   TRIG 313.0 (H) 03/11/2022 0832   HDL 41.10 03/11/2022 0832   CHOLHDL 4 03/11/2022 0832   VLDL 62.6 (H) 03/11/2022 0832   LDLDIRECT 73.0 03/11/2022 0832   Hepatic Function Panel     Component Value Date/Time   PROT 6.8 03/11/2022 0832   ALBUMIN 4.1 03/11/2022 0832   AST 14 03/11/2022 0832   ALT 12 03/11/2022 0832   ALKPHOS 91 03/11/2022 0832   BILITOT 0.2 03/11/2022 0832      Component Value Date/Time   TSH 1.22 03/11/2022 [id number]   Nutritional Lab Results  Component Value  Date   VD25OH 33.2 04/20/2022     ASSESSMENT AND PLAN  TREATMENT PLAN FOR OBESITY:  Recommended Dietary Goals  Rachael Mosley is currently in the action stage of change. As such, her goal is to continue weight management plan. She has agreed to the [employer].  Behavioral Intervention  We discussed the following Behavioral Modification Strategies today: increasing lean protein intake, increasing vegetables, increasing fiber rich foods, increasing water intake, and work on meal planning and easy cooking plans.  Additional resources provided today: NA  Recommended Physical Activity Goals  Rachael Mosley has been advised to work up to 150 minutes of moderate  intensity aerobic activity a week and strengthening exercises 2-3 times per week for cardiovascular health, weight loss maintenance and preservation of muscle mass.   She has agreed to Will begin resistance exercise 20 minutes, 3 times per week. Chosen activity weight training.   Pharmacotherapy We discussed various medication options to help Rachael Mosley with her weight loss efforts and we both agreed to metformin.  ASSOCIATED CONDITIONS ADDRESSED TODAY  Pre-diabetes Assessment & Plan: Lab Results  Component Value Date   HGBA1C 5.9 (H) 08/10/2022    Doing well with weight reduction and prescribed dietary plan.  Tolerating metformin XR 500 mg 2 tabs twice daily with food better than short acting metformin without GI side effects.  Plan to recheck chemistry panel, A1c, fasting insulin next visit.  Consider reducing metformin XR to once daily if A1c is improving. For treatment of prediabetes, recommend 30 minutes of walking 5 days a week or more.   BMI 27.0-27.9,adult  Generalized obesity  Vitamin D deficiency Assessment & Plan: Last vitamin D Lab Results  Component Value Date   VD25OH 33.2 04/20/2022   Patient has been taking OTC vitamin D 6000 IU once daily.  We reviewed a target goal vitamin D 50-70.  Plan to recheck vitamin D level next visit.   Elevated triglycerides with high cholesterol Assessment & Plan: Lab Results  Component Value Date   CHOL 151 03/11/2022   HDL 41.10 03/11/2022   LDLDIRECT 73.0 03/11/2022   TRIG 313.0 (H) 03/11/2022   CHOLHDL 4 03/11/2022  Patient is tolerating atorvastatin 20 mg once daily without myalgias.  She has done well on prescribed dietary plan which is low in saturated fat and low in trans fat.  She does plan to increase her walking time.  Continue to limit intake of high fat/high sugar foods.  Recheck fasting lipid panel next visit.       No follow-ups on file.Marland Kitchen She was informed of the importance of frequent follow up visits to  maximize her success with intensive lifestyle modifications for her multiple health conditions.   ATTESTASTION STATEMENTS:  Reviewed by clinician on day of visit: allergies, medications, problem list, medical history, surgical history, family history, social history, and previous encounter notes.   I have personally spent 30 minutes total time today in preparation, patient care, nutritional counseling and documentation for this visit, including the following: review of clinical lab tests; review of medical tests/procedures/services.      Dell Ponto, DO

## 2022-11-22 NOTE — Assessment & Plan Note (Signed)
Lab Results  Component Value Date   HGBA1C 5.9 (H) 08/10/2022    Doing well with weight reduction and prescribed dietary plan.  Tolerating metformin XR 500 mg 2 tabs twice daily with food better than short acting metformin without GI side effects.  Plan to recheck chemistry panel, A1c, fasting insulin next visit.  Consider reducing metformin XR to once daily if A1c is improving. For treatment of prediabetes, recommend 30 minutes of walking 5 days a week or more.

## 2022-11-22 NOTE — Assessment & Plan Note (Signed)
Lab Results  Component Value Date   CHOL 151 03/11/2022   HDL 41.10 03/11/2022   LDLDIRECT 73.0 03/11/2022   TRIG 313.0 (H) 03/11/2022   CHOLHDL 4 03/11/2022  Patient is tolerating atorvastatin 20 mg once daily without myalgias.  She has done well on prescribed dietary plan which is low in saturated fat and low in trans fat.  She does plan to increase her walking time.  Continue to limit intake of high fat/high sugar foods.  Recheck fasting lipid panel next visit.

## 2022-11-23 ENCOUNTER — Encounter: Payer: Self-pay | Admitting: Dermatology

## 2022-11-24 ENCOUNTER — Encounter (INDEPENDENT_AMBULATORY_CARE_PROVIDER_SITE_OTHER): Payer: Self-pay

## 2022-12-13 ENCOUNTER — Encounter: Payer: Self-pay | Admitting: Nurse Practitioner

## 2022-12-13 ENCOUNTER — Ambulatory Visit (INDEPENDENT_AMBULATORY_CARE_PROVIDER_SITE_OTHER): Payer: Medicare Other | Admitting: Nurse Practitioner

## 2022-12-13 VITALS — BP 96/60 | HR 82 | Temp 97.8°F | Resp 16 | Ht 64.0 in | Wt 161.2 lb

## 2022-12-13 DIAGNOSIS — R7303 Prediabetes: Secondary | ICD-10-CM | POA: Diagnosis not present

## 2022-12-13 DIAGNOSIS — E663 Overweight: Secondary | ICD-10-CM | POA: Diagnosis not present

## 2022-12-13 DIAGNOSIS — I10 Essential (primary) hypertension: Secondary | ICD-10-CM

## 2022-12-13 DIAGNOSIS — Z122 Encounter for screening for malignant neoplasm of respiratory organs: Secondary | ICD-10-CM

## 2022-12-13 DIAGNOSIS — F3132 Bipolar disorder, current episode depressed, moderate: Secondary | ICD-10-CM

## 2022-12-13 DIAGNOSIS — G4733 Obstructive sleep apnea (adult) (pediatric): Secondary | ICD-10-CM

## 2022-12-13 DIAGNOSIS — F17200 Nicotine dependence, unspecified, uncomplicated: Secondary | ICD-10-CM

## 2022-12-13 DIAGNOSIS — Z13820 Encounter for screening for osteoporosis: Secondary | ICD-10-CM

## 2022-12-13 DIAGNOSIS — Z8262 Family history of osteoporosis: Secondary | ICD-10-CM

## 2022-12-13 NOTE — Assessment & Plan Note (Signed)
Has been better currently on metformin for antipsychotic induced hyperglycemia.  Patient also been working on lifestyle modifications pending blood work continue metformin

## 2022-12-13 NOTE — Assessment & Plan Note (Signed)
Patient was diagnosed with sleep apnea and auto CPAP was ordered.  Patient states she has an appointment later this week to start on therapy continue following with pulmonology as recommended

## 2022-12-13 NOTE — Assessment & Plan Note (Signed)
Patient currently followed by the "step clinic" at [facility].  Continue following with the clinic as recommended and taking medication as prescribed

## 2022-12-13 NOTE — Progress Notes (Signed)
Established Patient Office Visit  Subjective   Patient ID: Rachael Mosley, female    DOB: 07-Jul-1966  Age: 57 y.o. MRN: [id number]  Chief Complaint  Patient presents with   Annual Exam    HPI  HTN: states that she is doing well off the medication.  Blood pressure has been within normal limits when she has been at other doctors offices having it checked.  Patient denies any lightheadedness or dizziness.  OSA: Auto PAP. States that she has not gotten the machin eyet has appt this Thursday   Bipolar: The step clinic [facility] and manages patient's Abilify, gabapentin, hydroxyzine, lamotrigine, venlafaxine.  for complete physical and follow up of chronic conditions.  Immunizations: -Tetanus: Completed in 2020 -Influenza: UTD -Shingles:  UTD at the pharmacy  -Pneumonia: UTD  Diet: Fair diet. States that she is doing 2-3 meals a day and 2-3 snacks a day.  Exercise:  6 times a week 60 mins a day, mainly walking.  Eye exam: Completes annually. Wears glasses. Within the year. Was followed  for Glaucoma   Dental exam: Completes semi-annually    Colonoscopy: Completed in 2021, repeat 2028 Lung Cancer Screening: Completed in Needs referral   Mammogram: 06/10/2022 Pap smear; 05/18/2022  Sleep: states that she falls asleep watching tv 8-930. States she will get back to sleep for a few hours. Pending APAP    Right thumb left big and big toe.     Review of Systems  Constitutional:  Negative for chills and fever.  Respiratory:  Negative for shortness of breath.   Cardiovascular:  Negative for chest pain and leg swelling.  Gastrointestinal:  Negative for abdominal pain, blood in stool, constipation, diarrhea, nausea and vomiting.       BM daily   Genitourinary:  Negative for dysuria and hematuria.  Neurological:  Negative for dizziness, tingling and headaches.  Psychiatric/Behavioral:  Negative for hallucinations and suicidal ideas.       Objective:     BP 96/60   Pulse 82    Temp 97.8 F (36.6 C)   Resp 16   Ht 5\' 4"  (1.626 m)   Wt 161 lb 4 oz (73.1 kg)   LMP 05/21/2020 (Approximate)   SpO2 97%   BMI 27.68 kg/m  BP Readings from Last 3 Encounters:  12/13/22 96/60  11/22/22 115/78  11/15/22 98/60   Wt Readings from Last 3 Encounters:  12/13/22 161 lb 4 oz (73.1 kg)  11/22/22 159 lb (72.1 kg)  11/15/22 164 lb 2 oz (74.4 kg)      Physical Exam Vitals and nursing note reviewed.  Constitutional:      Appearance: Normal appearance.  HENT:     Right Ear: Tympanic membrane, ear canal and external ear normal.     Left Ear: Tympanic membrane, ear canal and external ear normal.     Mouth/Throat:     Mouth: Mucous membranes are moist.     Pharynx: Oropharynx is clear.  Eyes:     Extraocular Movements: Extraocular movements intact.     Pupils: Pupils are equal, round, and reactive to light.  Cardiovascular:     Rate and Rhythm: Normal rate and regular rhythm.     Pulses: Normal pulses.     Heart sounds: Normal heart sounds.  Pulmonary:     Effort: Pulmonary effort is normal.     Breath sounds: Normal breath sounds.  Abdominal:     General: Bowel sounds are normal. There is no distension.  Palpations: There is no mass.     Tenderness: There is no abdominal tenderness.     Hernia: No hernia is present.  Musculoskeletal:     Right lower leg: No edema.     Left lower leg: No edema.  Lymphadenopathy:     Cervical: No cervical adenopathy.  Skin:    General: Skin is warm.  Neurological:     General: No focal deficit present.     Mental Status: She is alert.     Deep Tendon Reflexes:     Reflex Scores:      Bicep reflexes are 2+ on the right side and 2+ on the left side.      Patellar reflexes are 2+ on the right side and 2+ on the left side.    Comments: Bilateral upper and lower extremity strength 5/5  Psychiatric:        Mood and Affect: Mood normal.        Behavior: Behavior normal.        Thought Content: Thought content normal.         Judgment: Judgment normal.      No results found for any visits on 12/13/22.    The 10-year ASCVD risk score (Arnett DK, et al., 2019) is: 1%    Assessment & Plan:   Problem List Items Addressed This Visit       Cardiovascular and Mediastinum   Primary hypertension - Primary    Patient currently maintained on lifestyle modifications only.  Blood pressure within normal limits.  Patient denies any lightheadedness or dizziness.  Currently stable continue off of medication      Relevant Orders   CBC   Comprehensive metabolic panel   TSH     Respiratory   OSA (obstructive sleep apnea)    Patient was diagnosed with sleep apnea and auto CPAP was ordered.  Patient states she has an appointment later this week to start on therapy continue following with pulmonology as recommended        Other   Bipolar disorder, current episode depressed, moderate ([place])    Patient currently followed by the "step clinic" at [facility].  Continue following with the clinic as recommended and taking medication as prescribed      Tobacco use disorder    Patient no longer smokes.      Pre-diabetes    Has been better currently on metformin for antipsychotic induced hyperglycemia.  Patient also been working on lifestyle modifications pending blood work continue metformin      Relevant Orders   Hemoglobin A1c   TSH   Other Visit Diagnoses     Overweight       Relevant Orders   Hemoglobin A1c   TSH   Screening for lung cancer       Relevant Orders   Ambulatory Referral Lung Cancer Screening [facility]   Screening for osteoporosis       Family history of osteoporosis           Return in about 1 year (around 12/13/2023) for CPE and Labs.    Romilda Garret, NP

## 2022-12-13 NOTE — Assessment & Plan Note (Signed)
Patient no longer smokes.

## 2022-12-13 NOTE — Patient Instructions (Signed)
Nice to see you today I will be in touch with the labs once I have the results Follow up with me in 1 year, sooner if you need me  

## 2022-12-13 NOTE — Assessment & Plan Note (Signed)
Patient currently maintained on lifestyle modifications only.  Blood pressure within normal limits.  Patient denies any lightheadedness or dizziness.  Currently stable continue off of medication

## 2022-12-14 LAB — COMPREHENSIVE METABOLIC PANEL
ALT: 17 U/L (ref 0–35)
AST: 21 U/L (ref 0–37)
Albumin: 4.4 g/dL (ref 3.5–5.2)
Alkaline Phosphatase: 73 U/L (ref 39–117)
BUN: 14 mg/dL (ref 6–23)
CO2: 27 mEq/L (ref 19–32)
Calcium: 10 mg/dL (ref 8.4–10.5)
Chloride: 102 mEq/L (ref 96–112)
Creatinine, Ser: 0.65 mg/dL (ref 0.40–1.20)
GFR: 99.86 mL/min (ref >60.00)
Glucose, Bld: 91 mg/dL (ref 70–99)
Potassium: 4.5 mEq/L (ref 3.5–5.1)
Sodium: 139 mEq/L (ref 135–145)
Total Bilirubin: 0.4 mg/dL (ref 0.2–1.2)
Total Protein: 7 g/dL (ref 6.0–8.3)

## 2022-12-14 LAB — CBC
HCT: 42.1 % (ref 36.0–46.0)
Hemoglobin: 13.9 g/dL (ref 12.0–15.0)
MCHC: 33 g/dL (ref 30.0–36.0)
MCV: 89.3 fl (ref 78.0–100.0)
Platelets: 289 10*3/uL (ref 150.0–400.0)
RBC: 4.72 Mil/uL (ref 3.87–5.11)
RDW: 15.3 % (ref 11.5–15.5)
WBC: 17 10*3/uL — ABNORMAL HIGH (ref 4.0–10.5)

## 2022-12-14 LAB — TSH: TSH: 0.92 u[IU]/mL (ref 0.35–5.50)

## 2022-12-14 LAB — HEMOGLOBIN A1C: Hgb A1c MFr Bld: 6 % (ref 4.6–6.5)

## 2022-12-16 ENCOUNTER — Other Ambulatory Visit: Payer: Self-pay | Admitting: Nurse Practitioner

## 2022-12-28 ENCOUNTER — Ambulatory Visit (INDEPENDENT_AMBULATORY_CARE_PROVIDER_SITE_OTHER): Payer: Medicare Other | Admitting: Family Medicine

## 2023-01-10 ENCOUNTER — Ambulatory Visit (INDEPENDENT_AMBULATORY_CARE_PROVIDER_SITE_OTHER): Payer: Medicare Other | Admitting: Family Medicine

## 2023-01-10 ENCOUNTER — Encounter: Payer: Self-pay | Admitting: Family Medicine

## 2023-01-10 VITALS — BP 120/80 | HR 87 | Temp 97.6°F | Ht 64.0 in | Wt 155.2 lb

## 2023-01-10 DIAGNOSIS — M19072 Primary osteoarthritis, left ankle and foot: Secondary | ICD-10-CM

## 2023-01-10 MED ORDER — TRIAMCINOLONE ACETONIDE 40 MG/ML IJ SUSP
20.0000 mg | Freq: Once | INTRAMUSCULAR | Status: AC
Start: 2023-01-10 — End: 2023-01-10
  Administered 2023-01-10: 20 mg via INTRA_ARTICULAR

## 2023-01-10 NOTE — Progress Notes (Signed)
Lamar Naef T. Mavi Un, MD, CAQ Sports Medicine [facility] [address] [place], [address]  Phone: [phone]  FAX: [phone]  Rachael Mosley - 57 y.o. female  MRN [id number]  Date of Birth: April 23, 1966  Date: 01/10/2023  PCP: Eden Emms, NP  Referral: Eden Emms, NP  Chief Complaint  Patient presents with   Toe Pain    Left Big Toe-Wants Injection   Subjective:   Rachael Mosley is a 57 y.o. very pleasant female patient with Body mass index is 26.65 kg/m. who presents with the following:  Step daughter's wedding in a couple of weeks.   Painful 1st MTP joint.  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  She has had a prior bunionectomy distantly.  She currently does still have a small bunion.  There is no redness or warmth.  She denies any trauma.  She has tried oral over-the-counter medicines without much significant relief.  Inject 1st MTP joint, L  Review of Systems is noted in the HPI, as appropriate  Objective:   BP 120/80   Pulse 87   Temp 97.6 F (36.4 C) (Temporal)   Ht  (1.626 m)   Wt 155 lb 4 oz (70.4 kg)   LMP 05/21/2020 (Approximate)   SpO2 97%   BMI 26.65 kg/m   GEN: No acute distress; alert,appropriate. PULM: Breathing comfortably in no respiratory distress PSYCH: Normally interactive.   Left foot including all metatarsals, cuboid, cuneiform, navicular, cuboid, talus, calcaneus and the entirety of the forefoot, midfoot, and hindfoot are nontender with the exception of the first MTP joint on the left  Laboratory and Imaging Data:  Assessment and Plan:     ICD-10-CM   1. Osteoarthritis of first metatarsophalangeal (MTP) joint of left foot  M19.072 triamcinolone acetonide (KENALOG-40) injection 20 mg     Acute on chronic left-sided first MTP joint osteoarthritis with exacerbation.  Continue with conservative care at home including oral over-the-counter analgesics.  Aspiration/Injection  Procedure Note Rachael Mosley 03-09-1966 Date of procedure: 01/10/2023  Procedure: Small Joint Aspiration / Injection of Great Toe MTP Joint, L Indications: Pain  Procedure Details Verbal consent obtained from patient. Risks (including potential risk of bleeding, rare risk of tendon injury), benefits, and alternatives reviewed. Prepped with Chloraprep. Ethyl Chloride for anesthesia. Great toe held in traction and plantarflexed 30 degrees. Under sterile conditions, needle inserted perpendicularly and 1/2 cc of Lidocaine 1% and Kenalog 20 mg injected. No resistance met. No blood on aspiration. Decreased pain post-injection. Medication: 1/2 cc of Kenalog 40 mg (equaling Kenalog 20 mg)   Medication Management during today's office visit: Meds ordered this encounter  Medications   triamcinolone acetonide (KENALOG-40) injection 20 mg   Medications Discontinued During This Encounter  Medication Reason   venlafaxine (EFFEXOR) 37.5 MG tablet Dose change    Orders placed today for conditions managed today: No orders of the defined types were placed in this encounter.   Disposition: No follow-ups on file.  [employer] One speech-to-text software was used for transcription in this dictation.  Possible transcriptional errors can occur using Animal nutritionist.   Signed,  Elpidio Galea. Brayen Bunn, MD   Outpatient Encounter Medications as of 01/10/2023  Medication Sig   ARIPiprazole (ABILIFY) 15 MG tablet Take 15 mg by mouth daily.   atorvastatin (LIPITOR) 20 MG tablet Take 1 tablet (20 mg total) by mouth daily.   augmented betamethasone dipropionate (DIPROLENE-AF) 0.05 % ointment Apply topically daily.  Azelastine HCl 137 MCG/SPRAY SOLN SMARTSIG:1-2 Spray(s) Both Nares Twice Daily   estradiol (ESTRACE) 0.1 MG/GM vaginal cream Insert 1g nightly for 1 wk, then 1 g once weekly as maintenace   fluticasone (FLONASE) 50 MCG/ACT nasal spray Place 1 spray into both nostrils 2 (two) times daily.    gabapentin (NEURONTIN) 300 MG capsule Take 600 mg by mouth 2 (two) times daily.   hydrOXYzine (ATARAX) 50 MG tablet Take by mouth.   lamoTRIgine (LAMICTAL) 200 MG tablet Take 200 mg by mouth 2 (two) times daily.   metFORMIN (GLUCOPHAGE-XR) 500 MG 24 hr tablet Take 2 tablets (1,000 mg total) by mouth 2 (two) times daily with a meal.   mometasone (ELOCON) 0.1 % cream Apply to affected skin 5 nights a weej   omeprazole (PRILOSEC) 40 MG capsule TAKE 1 CAPSULE DAILY   venlafaxine XR (EFFEXOR-XR) 150 MG 24 hr capsule Take 300 mg by mouth daily with breakfast.   venlafaxine XR (EFFEXOR-XR) 75 MG 24 hr capsule Take 75 mg by mouth daily with breakfast.   [DISCONTINUED] venlafaxine (EFFEXOR) 37.5 MG tablet Take 375 mg by mouth daily.   [EXPIRED] triamcinolone acetonide (KENALOG-40) injection 20 mg    No facility-administered encounter medications on file as of 01/10/2023.

## 2023-01-13 ENCOUNTER — Ambulatory Visit (INDEPENDENT_AMBULATORY_CARE_PROVIDER_SITE_OTHER): Payer: Medicare Other | Admitting: Family Medicine

## 2023-01-13 ENCOUNTER — Encounter (INDEPENDENT_AMBULATORY_CARE_PROVIDER_SITE_OTHER): Payer: Self-pay | Admitting: Family Medicine

## 2023-01-13 VITALS — BP 131/87 | HR 80 | Temp 98.2°F | Ht 64.0 in | Wt 153.0 lb

## 2023-01-13 DIAGNOSIS — E785 Hyperlipidemia, unspecified: Secondary | ICD-10-CM | POA: Diagnosis not present

## 2023-01-13 DIAGNOSIS — D72829 Elevated white blood cell count, unspecified: Secondary | ICD-10-CM | POA: Insufficient documentation

## 2023-01-13 DIAGNOSIS — E669 Obesity, unspecified: Secondary | ICD-10-CM

## 2023-01-13 DIAGNOSIS — Z6826 Body mass index (BMI) 26.0-26.9, adult: Secondary | ICD-10-CM

## 2023-01-13 DIAGNOSIS — R7303 Prediabetes: Secondary | ICD-10-CM

## 2023-01-13 DIAGNOSIS — E559 Vitamin D deficiency, unspecified: Secondary | ICD-10-CM | POA: Diagnosis not present

## 2023-01-13 NOTE — Progress Notes (Signed)
Office: [phone]  /  Fax: [phone]  WEIGHT SUMMARY AND BIOMETRICS  Starting Date: 04/20/22  Starting Weight: 188lb   Weight Lost Since Last Visit: 6lb   Vitals Temp: 98.2 F (36.8 C) BP: 131/87 Pulse Rate: 80 SpO2: 98 %   Body Composition  Body Fat %: 36.1 % Fat Mass (lbs): 55.2 lbs Muscle Mass (lbs): 92.8 lbs Total Body Water (lbs): 69.2 lbs Visceral Fat Rating : 8   HPI  Chief Complaint: OBESITY  Rachael Mosley is here to discuss her progress with her obesity treatment plan. She is on the the [facility] and states she is following her eating plan approximately 80 % of the time. She states she is exercising 0 minutes 0 times per week.   Interval History:  Since last office visit she is down 6 lb She lost 35 lb in 8 mos She is headed out of state for 2 more trips She plans to join the [employer] She was in [place] for 3 weeks- her brother was in a bad MVA Support system is good  Denies hunger or cravings Stress eating is under good control  Pharmacotherapy: metformin 1000 mg bid  PHYSICAL EXAM:  Blood pressure 131/87, pulse 80, temperature 98.2 F (36.8 C), height  (1.626 m), weight 153 lb (69.4 kg), last menstrual period 05/21/2020, SpO2 98 %. Body mass index is 26.26 kg/m.  General: She is overweight, cooperative, alert, well developed, and in no acute distress. PSYCH: Has normal mood, affect and thought process.   Lungs: Normal breathing effort, no conversational dyspnea.   ASSESSMENT AND PLAN  TREATMENT PLAN FOR OBESITY:  Recommended Dietary Goals  Rachael Mosley is currently in the action stage of change. As such, her goal is to continue weight management plan. She has agreed to the [employer].  Behavioral Intervention  We discussed the following Behavioral Modification Strategies today: increasing lean protein intake, decreasing simple carbohydrates , increasing vegetables, increasing lower glycemic fruits, increasing water intake, work  on meal planning and preparation, work on managing stress, creating time for self-care and relaxation measures, continue to practice mindfulness when eating, and planning for success.  Additional resources provided today: NA  Recommended Physical Activity Goals  Rachael Mosley has been advised to work up to 150 minutes of moderate intensity aerobic activity a week and strengthening exercises 2-3 times per week for cardiovascular health, weight loss maintenance and preservation of muscle mass.   She has agreed to [employer] strengthening exercises with a goal of 2-3 sessions a week   Pharmacotherapy changes for the treatment of obesity: none  ASSOCIATED CONDITIONS ADDRESSED TODAY  Pre-diabetes Assessment & Plan: Lab Results  Component Value Date   HGBA1C 6.0 12/13/2022   She is doing well on metformin 1,000 mg twice daily without adverse side effects She has had IR related to use of anti psychotic medications and metformin has helped to offset the weight gain SE Her A1c is stable She has lost 35 lb in 8 mos of medically supervised weight management.  Continue current dietary plan.  Continue metformin 46962 mg bid.    Orders: -     Insulin, random -     Vitamin B12  Leukocytosis, unspecified type Assessment & Plan: Lab Results  Component Value Date   WBC 17.0 (H) 12/13/2022   HGB 13.9 12/13/2022   HCT 42.1 12/13/2022   MCV 89.3 12/13/2022   PLT 289.0 12/13/2022   Her WBC were elevated with her PCP 3/25 without illness.  She is on a  topical steroid cream per her dermatologist.  Will repeat her CBC today and cc her PCP results  Orders: -     CBC  Vitamin D deficiency Assessment & Plan: Last vitamin D Lab Results  Component Value Date   VD25OH 33.2 04/20/2022   She has been taking an OTC vitamin D3 4,000 IU daily supplement Energy levels have improved  Recheck vitamin D level today  Orders: -     VITAMIN D 25 Hydroxy (Vit-D Deficiency, Fractures)  Generalized  obesity  BMI 26.0-26.9,adult  Hyperlipidemia, unspecified hyperlipidemia type Assessment & Plan: Lab Results  Component Value Date   CHOL 151 03/11/2022   HDL 41.10 03/11/2022   LDLDIRECT 73.0 03/11/2022   TRIG 313.0 (H) 03/11/2022   CHOLHDL 4 03/11/2022   She is doing well on Atorvastatin 20 mg daily without adverse side effects Shew has done well with dietary changes, increased exercise and weight reduction  Recheck FLP today  Orders: -     Lipid panel      She was informed of the importance of frequent follow up visits to maximize her success with intensive lifestyle modifications for her multiple health conditions.   ATTESTASTION STATEMENTS:  Reviewed by clinician on day of visit: allergies, medications, problem list, medical history, surgical history, family history, social history, and previous encounter notes pertinent to obesity diagnosis.   I have personally spent 30 minutes total time today in preparation, patient care, nutritional counseling and documentation for this visit, including the following: review of clinical lab tests; review of medical tests/procedures/services.      [name], DO DABFM, DABOM [facility] [address] [place], [place] [address] [phone]

## 2023-01-13 NOTE — Assessment & Plan Note (Signed)
Lab Results  Component Value Date   WBC 17.0 (H) 12/13/2022   HGB 13.9 12/13/2022   HCT 42.1 12/13/2022   MCV 89.3 12/13/2022   PLT 289.0 12/13/2022   Her WBC were elevated with her PCP 3/25 without illness.  She is on a topical steroid cream per her dermatologist.  Will repeat her CBC today and cc her PCP results

## 2023-01-13 NOTE — Assessment & Plan Note (Signed)
Lab Results  Component Value Date   HGBA1C 6.0 12/13/2022   She is doing well on metformin 1,000 mg twice daily without adverse side effects She has had IR related to use of anti psychotic medications and metformin has helped to offset the weight gain SE Her A1c is stable She has lost 35 lb in 8 mos of medically supervised weight management.  Continue current dietary plan.  Continue metformin [address] mg bid.

## 2023-01-13 NOTE — Assessment & Plan Note (Signed)
Lab Results  Component Value Date   CHOL 151 03/11/2022   HDL 41.10 03/11/2022   LDLDIRECT 73.0 03/11/2022   TRIG 313.0 (H) 03/11/2022   CHOLHDL 4 03/11/2022   She is doing well on Atorvastatin 20 mg daily without adverse side effects Shew has done well with dietary changes, increased exercise and weight reduction  Recheck FLP today

## 2023-01-13 NOTE — Assessment & Plan Note (Signed)
Last vitamin D Lab Results  Component Value Date   VD25OH 33.2 04/20/2022   She has been taking an OTC vitamin D3 4,000 IU daily supplement Energy levels have improved  Recheck vitamin D level today

## 2023-01-14 LAB — LIPID PANEL
Chol/HDL Ratio: 2.8 ratio (ref 0.0–4.4)
Cholesterol, Total: 170 mg/dL (ref 100–199)
HDL: 60 mg/dL (ref >39)
LDL Chol Calc (NIH): 90 mg/dL (ref 0–99)
Triglycerides: 111 mg/dL (ref 0–149)
VLDL Cholesterol Cal: 20 mg/dL (ref 5–40)

## 2023-01-14 LAB — CBC
Hematocrit: 42 % (ref 34.0–46.6)
Hemoglobin: 13.9 g/dL (ref 11.1–15.9)
MCH: 29.1 pg (ref 26.6–33.0)
MCHC: 33.1 g/dL (ref 31.5–35.7)
MCV: 88 fL (ref 79–97)
Platelets: 303 10*3/uL (ref 150–450)
RBC: 4.78 x10E6/uL (ref 3.77–5.28)
RDW: 13.6 % (ref 11.7–15.4)
WBC: 10.3 10*3/uL (ref 3.4–10.8)

## 2023-01-14 LAB — INSULIN, RANDOM: INSULIN: 11.6 u[IU]/mL (ref 2.6–24.9)

## 2023-01-14 LAB — VITAMIN B12: Vitamin B-12: 421 pg/mL (ref 232–1245)

## 2023-01-14 LAB — VITAMIN D 25 HYDROXY (VIT D DEFICIENCY, FRACTURES): Vit D, 25-Hydroxy: 86.8 ng/mL (ref 30.0–100.0)

## 2023-01-27 ENCOUNTER — Ambulatory Visit: Payer: Medicare Other | Admitting: Dermatology

## 2023-02-01 ENCOUNTER — Ambulatory Visit: Payer: Medicare Other | Admitting: Gastroenterology

## 2023-02-15 ENCOUNTER — Ambulatory Visit (INDEPENDENT_AMBULATORY_CARE_PROVIDER_SITE_OTHER): Payer: Medicare Other | Admitting: Family Medicine

## 2023-03-01 NOTE — Progress Notes (Unsigned)
    Jerimiah Wolman T. Maddisen Vought, MD, CAQ Sports Medicine [facility] [address] [place], [address]  Phone: [phone]  FAX: [phone]  Madesyn Ast - 57 y.o. female  MRN [id number]  Date of Birth: 1966/08/22  Date: 03/02/2023  PCP: Eden Emms, NP  Referral: Eden Emms, NP  No chief complaint on file.  Subjective:   Rachael Mosley is a 57 y.o. very pleasant female patient with There is no height or weight on file to calculate BMI. who presents with the following:  Patient presents with ongoing foot pain.    Review of Systems is noted in the HPI, as appropriate  Objective:   LMP 05/21/2020 (Approximate)   GEN: No acute distress; alert,appropriate. PULM: Breathing comfortably in no respiratory distress PSYCH: Normally interactive.   Laboratory and Imaging Data:  Assessment and Plan:   ***

## 2023-03-02 ENCOUNTER — Encounter: Payer: Self-pay | Admitting: Family Medicine

## 2023-03-02 ENCOUNTER — Ambulatory Visit (INDEPENDENT_AMBULATORY_CARE_PROVIDER_SITE_OTHER): Payer: Medicare Other | Admitting: Family Medicine

## 2023-03-02 VITALS — BP 100/62 | HR 74 | Temp 98.2°F | Ht 64.0 in | Wt 158.5 lb

## 2023-03-02 DIAGNOSIS — M19072 Primary osteoarthritis, left ankle and foot: Secondary | ICD-10-CM

## 2023-03-02 MED ORDER — TRIAMCINOLONE ACETONIDE 40 MG/ML IJ SUSP
20.0000 mg | Freq: Once | INTRAMUSCULAR | Status: AC
Start: 2023-03-02 — End: 2023-03-02
  Administered 2023-03-02: 20 mg via INTRA_ARTICULAR

## 2023-03-08 ENCOUNTER — Ambulatory Visit: Payer: Medicare Other | Admitting: Obstetrics and Gynecology

## 2023-03-09 ENCOUNTER — Ambulatory Visit (INDEPENDENT_AMBULATORY_CARE_PROVIDER_SITE_OTHER): Payer: Medicare Other | Admitting: Family Medicine

## 2023-03-09 ENCOUNTER — Encounter (INDEPENDENT_AMBULATORY_CARE_PROVIDER_SITE_OTHER): Payer: Self-pay | Admitting: Family Medicine

## 2023-03-09 VITALS — BP 133/84 | HR 77 | Temp 98.9°F | Ht 64.0 in | Wt 153.0 lb

## 2023-03-09 DIAGNOSIS — Z6826 Body mass index (BMI) 26.0-26.9, adult: Secondary | ICD-10-CM

## 2023-03-09 DIAGNOSIS — R7303 Prediabetes: Secondary | ICD-10-CM

## 2023-03-09 DIAGNOSIS — E669 Obesity, unspecified: Secondary | ICD-10-CM

## 2023-03-09 DIAGNOSIS — E559 Vitamin D deficiency, unspecified: Secondary | ICD-10-CM | POA: Diagnosis not present

## 2023-03-09 MED ORDER — METFORMIN HCL ER 500 MG PO TB24
1000.0000 mg | ORAL_TABLET | Freq: Two times a day (BID) | ORAL | 0 refills | Status: AC
Start: 2023-03-09 — End: ?

## 2023-03-09 NOTE — Assessment & Plan Note (Signed)
Lab Results  Component Value Date   HGBA1C 6.0 12/13/2022   Reviewed labs from last visit.  Stable A1c on metformin 1,000 mg bid and is tolerating this well.  Weight reduction has not really reduced her A1c likely indicating that genetics and /or use of mood medications are also contributing to rise in blood glucose.  She has ramped her walking time to 1 hr most days of the week and avoids high sugar items  Continue to limit sweets and starches.  Focus on lean protein and complex carbohydrates with meals. Continue regular exercise and establish care with a new PCP in [place].

## 2023-03-09 NOTE — Assessment & Plan Note (Signed)
Reviewed patient's overall progress and she has done well with 18.6% TBW loss in 10 mos with healthy lifestyle changes + use of metformin alone.  We discussed maintenance plan for tracking calories, focusing on lean protein and fiber with meals, mindful eating, >150 min of moderate intensity exercise weekly to help maintain weight loss and adding in resistance training 2 x a week.  Reviewed recent labs and copy provided to pt.  She is moving to [place] in the next 2 weeks.

## 2023-03-09 NOTE — Progress Notes (Signed)
Office: [phone]  /  Fax: [phone]  WEIGHT SUMMARY AND BIOMETRICS  Starting Date: 04/20/22  Starting Weight: 188lb   Weight Lost Since Last Visit: 0lb   Vitals Temp: 98.9 F (37.2 C) BP: 133/84 Pulse Rate: 77 SpO2: 97 %   Body Composition  Body Fat %: 35 % Fat Mass (lbs): 53.6 lbs Muscle Mass (lbs): 94.6 lbs Total Body Water (lbs): 69.2 lbs Visceral Fat Rating : 7     HPI  Chief Complaint: OBESITY  Rachael Mosley is here to discuss her progress with her obesity treatment plan. She is on the the [facility] and states she is following her eating plan approximately 90 % of the time. She states she is exercising 60 minutes 5 times per week.   Interval History:  Since last office visit she is down 0 lb She has gained 1.8 lb of muscle and lost 1.6 lb of body fat in the past 2 mos She is moving to [place] the end of the month to be close to her daughter and Rachael Mosley She has traveled between visits and came off meal plan She has is down 35 lb in the past 10 mos of medically supervised weight management This is an 18.6% TBW loss since starting our program and has not used anti obesity medications Her BMI is now down to 26, % body fat is now at 35% and VFR is down to 7. She would be happy maintaining her weight around 150 lb.  Pharmacotherapy: metformin 1,000 mg bid for prediabetes  PHYSICAL EXAM:  Blood pressure 133/84, pulse 77, temperature 98.9 F (37.2 C), height 5\' 4"  (1.626 m), weight 153 lb (69.4 kg), last menstrual period 05/21/2020, SpO2 97 %. Body mass index is 26.26 kg/m.  General: She is overweight, cooperative, alert, well developed, and in no acute distress. PSYCH: Has normal mood, affect and thought process.   Lungs: Normal breathing effort, no conversational dyspnea.   ASSESSMENT AND PLAN  TREATMENT PLAN FOR OBESITY:  Recommended Dietary Goals  Rachael Mosley is currently in the action stage of change. As such, her goal is to continue weight  management plan. She has agreed to keeping a food journal and adhering to recommended goals of 1800 calories and 80 g of  protein. - 1800 cal / day for maintenance of weight loss  Behavioral Intervention  We discussed the following Behavioral Modification Strategies today: increasing lean protein intake, decreasing simple carbohydrates , increasing vegetables, increasing lower glycemic fruits, increasing fiber rich foods, avoiding skipping meals, increasing water intake, work on meal planning and preparation, continue to practice mindfulness when eating, and planning for success.  Additional resources provided today: NA  Recommended Physical Activity Goals  Rachael Mosley has been advised to work up to 150 minutes of moderate intensity aerobic activity a week and strengthening exercises 2-3 times per week for cardiovascular health, weight loss maintenance and preservation of muscle mass.   She has agreed to [employer] strengthening exercises with a goal of 2-3 sessions a week  and Work on scheduling and tracking physical activity.  - continue walking 1 hr 5 days/ wk  Pharmacotherapy changes for the treatment of obesity: none  ASSOCIATED CONDITIONS ADDRESSED TODAY  Pre-diabetes Assessment & Plan: Lab Results  Component Value Date   HGBA1C 6.0 12/13/2022   Reviewed labs from last visit.  Stable A1c on metformin 1,000 mg bid and is tolerating this well.  Weight reduction has not really reduced her A1c likely indicating that genetics and /or use of mood medications  are also contributing to rise in blood glucose.  She has ramped her walking time to 1 hr most days of the week and avoids high sugar items  Continue to limit sweets and starches.  Focus on lean protein and complex carbohydrates with meals. Continue regular exercise and establish care with a new PCP in [place].  Orders: -     metFORMIN HCl ER; Take 2 tablets (1,000 mg total) by mouth 2 (two) times daily with a meal.  Dispense: 180 tablet;  Refill: 0  Generalized obesity with starting BMI 32 Assessment & Plan: Reviewed patient's overall progress and she has done well with 18.6% TBW loss in 10 mos with healthy lifestyle changes + use of metformin alone.  We discussed maintenance plan for tracking calories, focusing on lean protein and fiber with meals, mindful eating, >150 min of moderate intensity exercise weekly to help maintain weight loss and adding in resistance training 2 x a week.  Reviewed recent labs and copy provided to pt.  She is moving to [place] in the next 2 weeks.   BMI 26.0-26.9,adult  Vitamin D deficiency Assessment & Plan: Last vitamin D Lab Results  Component Value Date   VD25OH 86.8 01/13/2023   Reviewed labs from last visit.  She did reduce her OTC vitamin D from 8,000 international units to 4,000 international units daily 8 weeks ago. Energy level is stable Recommend DEXA scan q 2 years  Continue OTC vitamin D at 4,000 international units  daily and have level rechecked in 6 mos in [place]          She was informed of the importance of frequent follow up visits to maximize her success with intensive lifestyle modifications for her multiple health conditions.   ATTESTASTION STATEMENTS:  Reviewed by clinician on day of visit: allergies, medications, problem list, medical history, surgical history, family history, social history, and previous encounter notes pertinent to obesity diagnosis.   I have personally spent 30 minutes total time today in preparation, patient care, nutritional counseling and documentation for this visit, including the following: review of clinical lab tests; review of medical tests/procedures/services.      Rachael Brink, DO DABFM, DABOM [facility] [address] [place], [place] [address] [phone]

## 2023-03-09 NOTE — Assessment & Plan Note (Signed)
Last vitamin D Lab Results  Component Value Date   VD25OH 86.8 01/13/2023   Reviewed labs from last visit.  She did reduce her OTC vitamin D from 8,000 international units to 4,000 international units daily 8 weeks ago. Energy level is stable Recommend DEXA scan q 2 years  Continue OTC vitamin D at 4,000 international units  daily and have level rechecked in 6 mos in [place]

## 2023-03-26 ENCOUNTER — Encounter: Payer: Self-pay | Admitting: Nurse Practitioner

## 2023-03-26 DIAGNOSIS — E785 Hyperlipidemia, unspecified: Secondary | ICD-10-CM

## 2023-03-28 MED ORDER — ATORVASTATIN CALCIUM 20 MG PO TABS
20.00 mg | ORAL_TABLET | Freq: Every day | ORAL | 0 refills | Status: AC
Start: 2023-03-28 — End: ?

## 2023-03-28 MED ORDER — OMEPRAZOLE 40 MG PO CPDR: 40.0000 mg | DELAYED_RELEASE_CAPSULE | Freq: Every day | ORAL | 0 refills | Status: DC

## 2023-03-29 ENCOUNTER — Other Ambulatory Visit: Payer: Self-pay | Admitting: Nurse Practitioner

## 2023-03-29 MED ORDER — LIDOCAINE 5 % EX OINT: TOPICAL_OINTMENT | CUTANEOUS | 0 refills | Status: AC

## 2023-04-01 ENCOUNTER — Other Ambulatory Visit: Payer: Self-pay | Admitting: Nurse Practitioner

## 2023-04-05 MED ORDER — AZELASTINE HCL 137 MCG/SPRAY NA SOLN: 1.0000 | Freq: Two times a day (BID) | NASAL | 0 refills | Status: AC

## 2023-04-05 MED ORDER — OMEPRAZOLE 40 MG PO CPDR: 40.0000 mg | DELAYED_RELEASE_CAPSULE | Freq: Every day | ORAL | 0 refills | Status: DC

## 2023-04-05 MED ORDER — FLUTICASONE PROPIONATE 50 MCG/ACT NA SUSP: 1.0000 | Freq: Two times a day (BID) | NASAL | 0 refills | Status: AC

## 2023-04-15 ENCOUNTER — Encounter: Payer: Self-pay | Admitting: Emergency Medicine

## 2023-07-19 ENCOUNTER — Telehealth: Payer: Self-pay | Admitting: Nurse Practitioner

## 2023-07-19 NOTE — Telephone Encounter (Signed)
Contacted Rachael Mosley to schedule their annual wellness visit. Patient declined to schedule AWV at this time.Patient has moved out of state.   [facility] Care Guide [facility] AWV TEAM Direct Dial: [phone]

## 2023-12-15 ENCOUNTER — Encounter: Payer: Medicare Other | Admitting: Nurse Practitioner

## 2023-12-30 ENCOUNTER — Other Ambulatory Visit: Payer: Self-pay | Admitting: Nurse Practitioner
